# Patient Record
Sex: Male | Born: 1980 | Race: Black or African American | Hispanic: No | Marital: Married | Smoking: Current some day smoker
Health system: Southern US, Community
[De-identification: ages and names within clinical notes are randomized; demographics above are authoritative.]

## PROBLEM LIST (undated history)

## (undated) DIAGNOSIS — E669 Obesity, unspecified: Secondary | ICD-10-CM

## (undated) DIAGNOSIS — I10 Essential (primary) hypertension: Secondary | ICD-10-CM

---

## 2013-11-26 ENCOUNTER — Emergency Department: Payer: Self-pay | Admitting: Emergency Medicine

## 2015-06-03 ENCOUNTER — Emergency Department (HOSPITAL_COMMUNITY)
Admission: EM | Admit: 2015-06-03 | Discharge: 2015-06-03 | Disposition: A | Discharge status: Home / Self Care | Payer: Self-pay | Attending: Emergency Medicine | Admitting: Emergency Medicine

## 2015-06-03 ENCOUNTER — Encounter (HOSPITAL_COMMUNITY): Payer: Self-pay | Admitting: Emergency Medicine

## 2015-06-03 DIAGNOSIS — F17200 Nicotine dependence, unspecified, uncomplicated: Secondary | ICD-10-CM | POA: Insufficient documentation

## 2015-06-03 DIAGNOSIS — G510 Bell's palsy: Secondary | ICD-10-CM

## 2015-06-03 LAB — CBG MONITORING, ED: Glucose-Capillary: 85 mg/dL (ref 65–99)

## 2015-06-03 MED ORDER — HYPROMELLOSE (GONIOSCOPIC) 2.5 % OP SOLN: 2.0000 [drp] | OPHTHALMIC | Status: DC | PRN

## 2015-06-03 MED ORDER — PREDNISONE 20 MG PO TABS: 60.0000 mg | ORAL_TABLET | Freq: Every day | ORAL | Status: DC

## 2015-06-03 NOTE — ED Provider Notes (Signed)
CSN: [id number]     Arrival date & time 06/03/15  1104 History   First MD Initiated Contact with Patient 06/03/15 1127     Chief Complaint  Patient presents with  . Numbness    HPI  Adam Bush is an 34 y.o. male with no significant PMH who presents to the ED for evaluation of numbness on the R side of his face and loss of taste sensation. He states that three days ago he lost his sense of taste. He states that he has not been able to taste anything at all but can feel when there is something in his mouth. He reports he had a mint earlier today and could sort of taste it in the back of his mouth. He states that starting around 8:00AM this morning he noticed numbness to the R side of his face. He states he was brushing his teeth and was not able to close the right side of his mouth all the way to keep water in his mouth. States he noticed he also cannot close his R eye all the way. He states that he also has some decreased sensation on the R side of his face. Pt also endorses R jaw pain and earache for the past couple of days. He states the pain is constant, dull, and aching. He denies visual changes, hearing loss, dizziness, syncope. Denies weakness or numbness anywhere else in his body. Denies vertigo or ambulation problems. Denies chest pain, SOB, fever, chills, urinary issues, bowel/bladder incontinence. He does admit to smoking 5-10 cigarettes daily. Endorses recent increased stress due to moving and changes at his job.   History reviewed. No pertinent past medical history. History reviewed. No pertinent past surgical history. No family history on file. Social History  Substance Use Topics  . Smoking status: Current Every Day Smoker  . Smokeless tobacco: None  . Alcohol Use: Yes    Review of Systems  All other systems reviewed and are negative.     Allergies  Review of patient's allergies indicates no known allergies.  Home Medications   Prior to Admission medications    Medication Sig Start Date End Date Taking? Authorizing Provider  acetaminophen (TYLENOL) 500 MG tablet Take 2,000 mg by mouth daily as needed for headache.   Yes Historical Provider, MD   BP 150/91 mmHg  Pulse 101  Temp(Src) 98.2 F (36.8 C)  Resp 18  Ht  (1.753 m)  Wt 320 lb (145.151 kg)  BMI 47.23 kg/m2  SpO2 95% Physical Exam  Constitutional: He is oriented to person, place, and time. No distress.  HENT:  Head: Atraumatic.  Right Ear: External ear normal.  Left Ear: External ear normal.  Nose: Nose normal.  Mouth/Throat: Uvula is midline, oropharynx is clear and moist and mucous membranes are normal. No trismus in the jaw. No oropharyngeal exudate.  Eyes: Conjunctivae and EOM are normal. Pupils are equal, round, and reactive to light.  Neck: Normal range of motion. Neck supple. No tracheal deviation present.  Cardiovascular: Normal rate, regular rhythm, normal heart sounds and intact distal pulses.   No murmur heard. Pulmonary/Chest: Effort normal and breath sounds normal. No respiratory distress. He has no wheezes.  Abdominal: Soft. Bowel sounds are normal. He exhibits no distension. There is no tenderness.  Musculoskeletal: Normal range of motion. He exhibits no edema or tenderness.  Lymphadenopathy:    He has no cervical adenopathy.  Neurological: He is alert and oriented to person, place, and time. He displays a  negative Romberg sign. Coordination and gait normal.  R eyebrow slightly less raised than L, R eye cannot close as tightly as L. EOM intact. Pt reports decreased sensation to touch on R cheek compared to L. Otherwise nonfocal neuro exam. Good strength and sensation in upper and lower extremities. No pronator drift or romberg. Normal gait.   Skin: Skin is warm and dry. He is not diaphoretic.  Psychiatric: He has a normal mood and affect.  Nursing note and vitals reviewed.   ED Course  Procedures (including critical care time) Labs Review Labs Reviewed  CBG  MONITORING, ED    Imaging Review No results found. I have personally reviewed and evaluated these images and lab results as part of my medical decision-making.   EKG Interpretation None      MDM   Final diagnoses:  Bell's palsy    Neuro exam shows slightly decreased movement in R side of face and decreased sensation in R side of face compared to L. Otherwise nonfocal neuro exam. Suspicion low for stroke/ischemic etiology. Appears consistent with Bell's palsy. No further workup indicated at this time. Will d/c with rx for prednisone and artificial tears. Strict return precautions given. Pt verbalized understanding.     Carlene CoriaSerena Y Madden Garron, PA-C 06/03/15 1239  Pricilla LovelessScott Goldston, MD 06/03/15 [phone]

## 2015-06-03 NOTE — ED Notes (Signed)
Declined W/C at D/C and was escorted to lobby by RN. 

## 2015-06-03 NOTE — ED Notes (Signed)
CBG 85

## 2015-06-03 NOTE — ED Notes (Signed)
Reports decreased taste sensation X 3 days, woke with facial numbness this AM, has mild facial droop, no other neuro deficits, alert, ambulatory and in NAD

## 2015-06-03 NOTE — Discharge Instructions (Signed)
Bell Palsy Bell palsy is a condition in which the muscles on one side of the face become paralyzed. This often causes one side of the face to droop. It is a common condition and most people recover completely. RISK FACTORS Risk factors for Bell palsy include:  Pregnancy.  Diabetes.  An infection by a virus, such as infections that cause cold sores. CAUSES  Bell palsy is caused by damage to or inflammation of a nerve in your face. It is unclear why this happens, but an infection by a virus may lead to it. Most of the time the reason it happens is unknown. SIGNS AND SYMPTOMS  Symptoms can range from mild to severe and can take place over a number of hours. Symptoms may include:  Being unable to:  Raise one or both eyebrows.  Close one or both eyes.  Feel parts of your face (facial numbness).  Drooping of the eyelid and corner of the mouth.  Weakness in the face.  Paralysis of half your face.  Loss of taste.  Sensitivity to loud noises.  Difficulty chewing.  Tearing up of the affected eye.  Dryness in the affected eye.  Drooling.  Pain behind one ear. DIAGNOSIS  Diagnosis of Bell palsy may include:  A medical history and physical exam.  An MRI.  A CT scan.  Electromyography (EMG). This is a test that checks how your nerves are working. TREATMENT  Treatment may include antiviral medicine to help shorten the length of the condition. Sometimes treatment is not needed and the symptoms go away on their own. HOME CARE INSTRUCTIONS   Take medicines only as directed by your health care provider.  Do facial massages and exercises as directed by your health care provider.  If your eye is affected:  Use moisturizing eye drops to prevent drying of your eye as directed by your health care provider.  Protect your eye as directed by your health care provider. SEEK MEDICAL CARE IF:  Your symptoms do not get better or get worse.  You are drooling.  Your eye is red,  irritated, or hurts. SEEK IMMEDIATE MEDICAL CARE IF:   Another part of your body feels weak or numb.  You have difficulty swallowing.  You have a fever along with symptoms of Bell palsy.  You develop neck pain. MAKE SURE YOU:   Understand these instructions.  Will watch your condition.  Will get help right away if you are not doing well or get worse.   This information is not intended to replace advice given to you by your health care provider. Make sure you discuss any questions you have with your health care provider.   Document Released: 07/04/2005 Document Revised: 03/25/2015 Document Reviewed: 10/11/2013 Elsevier Interactive Patient Education 2016 [employer].  Take medications as prescribed. Return to the emergency room for worsening condition or new concerning symptoms. Follow up with your regular doctor. If you don't have a regular doctor use one of the numbers below to establish a primary care doctor.   Emergency Department Resource Guide 1) Find a Doctor and Pay Out of Pocket Although you won't have to find out who is covered by your insurance plan, it is a good idea to ask around and get recommendations. You will then need to call the office and see if the doctor you have chosen will accept you as a new patient and what types of options they offer for patients who are self-pay. Some doctors offer discounts or will set up  payment plans for their patients who do not have insurance, but you will need to ask so you aren't surprised when you get to your appointment.  2) Contact Your Local Health Department Not all health departments have doctors that can see patients for sick visits, but many do, so it is worth a call to see if yours does. If you don't know where your local health department is, you can check in your phone book. The CDC also has a tool to help you locate your state's health department, and many state websites also have listings of all of their local health  departments.  3) Find a Walk-in Clinic If your illness is not likely to be very severe or complicated, you may want to try a walk in clinic. These are popping up all over the country in pharmacies, drugstores, and shopping centers. They're usually staffed by nurse practitioners or physician assistants that have been trained to treat common illnesses and complaints. They're usually fairly quick and inexpensive. However, if you have serious medical issues or chronic medical problems, these are probably not your best option.  No Primary Care Doctor: - Call Health Connect at  [phone] - they can help you locate a primary care doctor that  accepts your insurance, provides certain services, etc. - Physician Referral [phone]  Emergency Department Resource Guide 1) Find a Doctor and Pay Out of Pocket Although you won't have to find out who is covered by your insurance plan, it is a good idea to ask around and get recommendations. You will then need to call the office and see if the doctor you have chosen will accept you as a new patient and what types of options they offer for patients who are self-pay. Some doctors offer discounts or will set up payment plans for their patients who do not have insurance, but you will need to ask so you aren't surprised when you get to your appointment.  2) Contact Your Local Health Department Not all health departments have doctors that can see patients for sick visits, but many do, so it is worth a call to see if yours does. If you don't know where your local health department is, you can check in your phone book. The CDC also has a tool to help you locate your state's health department, and many state websites also have listings of all of their local health departments.  3) Find a Walk-in Clinic If your illness is not likely to be very severe or complicated, you may want to try a walk in clinic. These are popping up all over the country in pharmacies,  drugstores, and shopping centers. They're usually staffed by nurse practitioners or physician assistants that have been trained to treat common illnesses and complaints. They're usually fairly quick and inexpensive. However, if you have serious medical issues or chronic medical problems, these are probably not your best option.  No Primary Care Doctor: - Call Health Connect at  [phone] - they can help you locate a primary care doctor that  accepts your insurance, provides certain services, etc. - Physician Referral Service- [phone]  Chronic Pain Problems: Organization         Address  Phone   Notes  [facility]  [phone] Patients need to be referred by their primary care doctor.   Medication Assistance: Organization         Address  Phone   Notes  [facility] Medication Assistance Program [address] [place], [place] [address] [phone] --Must be a resident of [place] -- Must have NO insurance coverage whatsoever (no Medicaid/ Medicare, etc.) -- The pt. MUST have a primary care doctor that directs their care regularly and follows them in the community   MedAssist  [phone]   [employer]  [phone]    Agencies that provide inexpensive medical care: Organization         Address  Phone   Notes  [facility]  [phone]   [facility]    [phone]   [facility] [address] [place], [place] [address] [phone]   [facility] [place] [address], [place] [phone]   [facility]    [phone]   [facility]    [phone]) [phone]   [facility]  [address], [place] Phone:  [phone], Fax:  [phone]) [phone] Hours of Operation:  9 am - 6 pm, M-F.  Also accepts Medicaid/Medicare and self-pay.  [facility]  [address], [place] Phone:  [phone](336) [phone], Fax: [phone]. Hours of Operation:  8:30 am - 5:30 pm, M-F.  Also accepts Medicaid and self-pay.  [facility] [place] [address], [place] Phone: [phone](336) [phone]   [facility] [address], [place], [place] [phone], Ext. [phone] Mondays & Thursdays: 7-9 AM.  First 15 patients are seen on a first come, first serve basis.    Medicaid-accepting [place] [facility] [place] Providers:  Organization         Address  Phone   Notes  [facility] [address], [place] [phone] Also accepts self-pay patients.  [facility] [address], [place]  [phone]   [facility] [address], [place] [phone]   [facility] [address] [place] [address], [place] [phone]   Renaye RakersVeita Bland [address], [place]   [phone] Only accepts [place] patients after they have their name applied to their card.   Self-Pay (no insurance) in [place] [facility] [place]:  Organization         Address  Phone   Notes  Sickle Cell Patients, [facility] [address], [place] [phone]   [facility] [address], [place] [phone]) [phone]   [facility] [place]  [address], [place] [phone]   [facility]/Dr. Osei-Bonsu  [address] [place] [address], [place] or [phone], [place] [phone]) [phone] Phone number for both [place] and [place] locations is the same.  [facility] [address], [place] [phone]) [phone]   [facility] [place] [address] [place] [address], [place] or [address] [phone]   [facility] [address], [place] [phone]) [phone], phone; [phone], fax Sees patients 1st and 3rd Saturday of every month.  Must not qualify for public  or private insurance (i.e. Medicaid, Medicare, [place] Health Choice, Veterans' Benefits)  Household income should be no more than 200% of the poverty level The clinic cannot treat you if you are pregnant or think you are pregnant  Sexually transmitted diseases are not treated at the clinic.

## 2015-06-08 ENCOUNTER — Emergency Department (HOSPITAL_COMMUNITY)
Admission: EM | Admit: 2015-06-08 | Discharge: 2015-06-08 | Disposition: A | Discharge status: Home / Self Care | Payer: Self-pay | Attending: Emergency Medicine | Admitting: Emergency Medicine

## 2015-06-08 ENCOUNTER — Encounter (HOSPITAL_COMMUNITY): Payer: Self-pay | Admitting: *Deleted

## 2015-06-08 DIAGNOSIS — Z7952 Long term (current) use of systemic steroids: Secondary | ICD-10-CM | POA: Insufficient documentation

## 2015-06-08 DIAGNOSIS — Z87891 Personal history of nicotine dependence: Secondary | ICD-10-CM | POA: Insufficient documentation

## 2015-06-08 DIAGNOSIS — G510 Bell's palsy: Secondary | ICD-10-CM | POA: Insufficient documentation

## 2015-06-08 MED ORDER — VALACYCLOVIR HCL 1 G PO TABS: 1000.0000 mg | ORAL_TABLET | Freq: Three times a day (TID) | ORAL | Status: AC

## 2015-06-08 MED ORDER — PREDNISONE 20 MG PO TABS: 40.0000 mg | ORAL_TABLET | Freq: Every day | ORAL | Status: DC

## 2015-06-08 NOTE — ED Provider Notes (Signed)
CSN: [id number]     Arrival date & time 06/08/15  1540 History  By signing my name below, I, Murriel Hopper, attest that this documentation has been prepared under the direction and in the presence of Will Fitzhugh Vizcarrondo, PA-C.  Electronically Signed: Murriel Hopper, ED Scribe. 06/08/2015. 6:37 PM.    No chief complaint on file.     The history is provided by the patient. No language interpreter was used.   HPI Comments: Adam Bush is a 34 y.o. male who presents to the Emergency Department complaining of symptoms of Bell's Palsy, which he was diagnosed with on 11/16. Pt states that he was prescribed Prednisone and has been taking it as prescribed since then. Pt reports today still with same symptoms of right sided facial weakness. He also states that his lower lip has been numb since he was last seen in ED. Pt states that he does not feel that his symptoms have gotten better since he was diagnosed. He reports still having a 5/10 headache.  Pt also reports he is able to close his eyes most of the time, and his eye dryness since his last visit has been under control. Pt denies numbness, tingling, or weakness to any other part of his body. He denies fevers, neck stiffness, rashes, abdominal pain, or eye dryness.    History reviewed. No pertinent past medical history. History reviewed. No pertinent past surgical history. No family history on file. Social History  Substance Use Topics  . Smoking status: Former Smoker    Quit date: 06/03/2015  . Smokeless tobacco: None  . Alcohol Use: Yes    Review of Systems  Constitutional: Negative for fever and chills.  HENT: Negative for congestion, hearing loss, rhinorrhea, sore throat, tinnitus and trouble swallowing.   Eyes: Negative for pain, redness and visual disturbance.  Respiratory: Negative for cough and shortness of breath.   Cardiovascular: Negative for chest pain.  Gastrointestinal: Negative for nausea, vomiting, abdominal pain and diarrhea.   Genitourinary: Negative for dysuria.  Musculoskeletal: Negative for back pain, neck pain and neck stiffness.  Skin: Negative for rash.  Neurological: Positive for facial asymmetry, weakness, numbness and headaches. Negative for dizziness, seizures, syncope, speech difficulty and light-headedness.      Allergies  Review of patient's allergies indicates no known allergies.  Home Medications   Prior to Admission medications   Medication Sig Start Date End Date Taking? Authorizing Provider  acetaminophen (TYLENOL) 500 MG tablet Take 2,000 mg by mouth daily as needed for headache.    Historical Provider, MD  hydroxypropyl methylcellulose / hypromellose (ISOPTO TEARS / GONIOVISC) 2.5 % ophthalmic solution Place 2 drops into the right eye as needed for dry eyes. 06/03/15   Ace Gins Sam, PA-C  predniSONE (DELTASONE) 20 MG tablet Take 3 tablets (60 mg total) by mouth daily. 06/03/15   Ace Gins Sam, PA-C  predniSONE (DELTASONE) 20 MG tablet Take 2 tablets (40 mg total) by mouth daily. 06/08/15   Everlene Farrier, PA-C  valACYclovir (VALTREX) 1000 MG tablet Take 1 tablet (1,000 mg total) by mouth 3 (three) times daily. 06/08/15 06/22/15  Everlene Farrier, PA-C   BP 142/106 mmHg  Pulse 94  Temp(Src) 98.1 F (36.7 C) (Oral)  Resp 16  Ht  (1.753 m)  Wt 153.117 kg  BMI 49.83 kg/m2  SpO2 96% Physical Exam  Constitutional: He is oriented to person, place, and time. He appears well-developed and well-nourished. No distress.  Nontoxic appearing.  HENT:  Head: Normocephalic and atraumatic.  Right Ear: External ear normal.  Left Ear: External ear normal.  Mouth/Throat: Oropharynx is clear and moist.  Right sided facial asymmetry consistent with bells palsy.   Eyes: Conjunctivae and EOM are normal. Pupils are equal, round, and reactive to light. Right eye exhibits no discharge. Left eye exhibits no discharge.  EOMs intact. Patient is able to close his right eye completely, but not as tight as his  left eye.   Neck: Normal range of motion. Neck supple.  Cardiovascular: Normal rate, regular rhythm, normal heart sounds and intact distal pulses.   Pulmonary/Chest: Effort normal and breath sounds normal. No respiratory distress. He has no wheezes. He has no rales.  Abdominal: Soft. There is no tenderness.  Musculoskeletal: Normal range of motion. He exhibits no edema.  Patient has 5 out of 5 strength in his bilateral upper and lower extremities. Normal gait.  Lymphadenopathy:    He has no cervical adenopathy.  Neurological: He is alert and oriented to person, place, and time. Coordination normal.  The patient is alert and oriented 3. Patient has right-sided facial asymmetry including his forehead which is consistent with Bell's palsy. Decreased sensation to the right side of his face. Patient has unequal smile. Patient is able to close his right eye completely but not as tightly as his left eye. No evidence of dry eye. Patient has good strength and sensation to his bilateral upper and lower extremities. Normal gait.  Skin: Skin is warm and dry. No rash noted. He is not diaphoretic. No erythema. No pallor.  Psychiatric: He has a normal mood and affect. His behavior is normal.  Nursing note and vitals reviewed.   ED Course  Procedures (including critical care time)  DIAGNOSTIC STUDIES: Oxygen Saturation is 96% on room air, normal by my interpretation.    COORDINATION OF CARE: 6:36 PM Discussed treatment plan with pt at bedside and pt agreed to plan.   Labs Review Labs Reviewed - No data to display  Imaging Review No results found.    EKG Interpretation None      Filed Vitals:   06/08/15 1553  BP: 142/106  Pulse: 94  Temp: 98.1 F (36.7 C)  TempSrc: Oral  Resp: 16  Height: 5\' 9"  (1.753 m)  Weight: 153.117 kg  SpO2: 96%     MDM   Meds given in ED:  Medications - No data to display  Discharge Medication List as of 06/08/2015  6:39 PM    START taking these  medications   Details  !! predniSONE (DELTASONE) 20 MG tablet Take 2 tablets (40 mg total) by mouth daily., Starting 06/08/2015, Until Discontinued, Print    valACYclovir (VALTREX) 1000 MG tablet Take 1 tablet (1,000 mg total) by mouth 3 (three) times daily., Starting 06/08/2015, Until Mon 06/22/15, Print     !! - Potential duplicate medications found. Please discuss with provider.      Final diagnoses:  Bell's palsy   This is a 34 year old male who presents to the emergency department with complaints of Bell's palsy. The patient was seen in the emergency department on 06/03/2015 is diagnosed with Bell's palsy. He reports his symptoms have persisted and not improved. He reports he's been taking his prednisone. He reports his dry eye has resolved. On examination is afebrile nontoxic appearing. Patient has right-sided facial asymmetry consistent with bells palsy. It includes his forehead. He is able to close his right eye completely, although not as tightly as his left eye. He has unequal smile. He has  decreased sensation to the right side of his face. Patient has good sensation and strength is bilateral upper and lower extremities. Patient's exam is consistent with persistent Bell's palsy. Patient has been using his eyedrops as needed. I encouraged him to continue to use them every day. Will have the patient continue another 5 day course of prednisone and start him on Valtrex. I provided more education to the patient about Bell's palsy and explained that the symptoms persist for a long period of time. I encouraged close follow up with PCP. I still have very low suspicion for stroke or ischemic etiology. We'll discharge with strict return precautions. I advised the patient to follow-up with their primary care provider this week. I advised the patient to return to the emergency department with new or worsening symptoms or new concerns. The patient verbalized understanding and agreement with plan.   This  patient was discussed with Dr. Donnald Garre who agrees with assessment and plan.   I personally performed the services described in this documentation, which was scribed in my presence. The recorded information has been reviewed and is accurate.       Everlene Farrier, PA-C 06/08/15 1851  Arby Barrette, MD 06/09/15 Moses Manners

## 2015-06-08 NOTE — ED Notes (Signed)
Pt states that he was here on Wednesday and was diagnosed with Bells Palsy and was prescribed Prednisone. States that he has been taking prednisone. States that initially he was taking the pills spread out but on Friday he started taking them as prescribed. States that his boss has sent him home from work because of his symptoms and thinks he needs to be checked out. States that his symptoms (numbness to right face) have no gotten better but have no gotten worse.

## 2015-06-08 NOTE — Discharge Instructions (Signed)
Bell Palsy °Bell palsy is a condition in which the muscles on one side of the face become paralyzed. This often causes one side of the face to droop. It is a common condition and most people recover completely. °RISK FACTORS °Risk factors for Bell palsy include: °· Pregnancy. °· Diabetes. °· An infection by a virus, such as infections that cause cold sores. °CAUSES  °Bell palsy is caused by damage to or inflammation of a nerve in your face. It is unclear why this happens, but an infection by a virus may lead to it. Most of the time the reason it happens is unknown. °SIGNS AND SYMPTOMS  °Symptoms can range from mild to severe and can take place over a number of hours. Symptoms may include: °· Being unable to: °¨ Raise one or both eyebrows. °¨ Close one or both eyes. °¨ Feel parts of your face (facial numbness). °· Drooping of the eyelid and corner of the mouth. °· Weakness in the face. °· Paralysis of half your face. °· Loss of taste. °· Sensitivity to loud noises. °· Difficulty chewing. °· Tearing up of the affected eye. °· Dryness in the affected eye. °· Drooling. °· Pain behind one ear. °DIAGNOSIS  °Diagnosis of Bell palsy may include: °· A medical history and physical exam. °· An MRI. °· A CT scan. °· Electromyography (EMG). This is a test that checks how your nerves are working. °TREATMENT  °Treatment may include antiviral medicine to help shorten the length of the condition. Sometimes treatment is not needed and the symptoms go away on their own. °HOME CARE INSTRUCTIONS  °· Take medicines only as directed by your health care provider. °· Do facial massages and exercises as directed by your health care provider. °· If your eye is affected: °¨ Use moisturizing eye drops to prevent drying of your eye as directed by your health care provider. °¨ Protect your eye as directed by your health care provider. °SEEK MEDICAL CARE IF: °· Your symptoms do not get better or get worse. °· You are drooling. °· Your eye is red,  irritated, or hurts. °SEEK IMMEDIATE MEDICAL CARE IF:  °· Another part of your body feels weak or numb. °· You have difficulty swallowing. °· You have a fever along with symptoms of Bell palsy. °· You develop neck pain. °MAKE SURE YOU:  °· Understand these instructions. °· Will watch your condition. °· Will get help right away if you are not doing well or get worse. °  °This information is not intended to replace advice given to you by your health care provider. Make sure you discuss any questions you have with your health care provider. °  °Document Released: 07/04/2005 Document Revised: 03/25/2015 Document Reviewed: 10/11/2013 °Elsevier Interactive Patient Education ©2016 [employer] ° °

## 2015-06-08 NOTE — ED Notes (Signed)
Pt. Stated, I have taken the prednisone and one dose left.  Its worse , I can't taste anything on the right side. Pt. Still has weakness ijn his right side of face .

## 2015-11-02 ENCOUNTER — Emergency Department (HOSPITAL_COMMUNITY)
Admission: EM | Admit: 2015-11-02 | Discharge: 2015-11-02 | Disposition: A | Discharge status: Home / Self Care | Payer: Self-pay | Attending: Emergency Medicine | Admitting: Emergency Medicine

## 2015-11-02 ENCOUNTER — Encounter (HOSPITAL_COMMUNITY): Payer: Self-pay | Admitting: *Deleted

## 2015-11-02 DIAGNOSIS — E669 Obesity, unspecified: Secondary | ICD-10-CM | POA: Insufficient documentation

## 2015-11-02 DIAGNOSIS — M546 Pain in thoracic spine: Secondary | ICD-10-CM | POA: Insufficient documentation

## 2015-11-02 DIAGNOSIS — Z87891 Personal history of nicotine dependence: Secondary | ICD-10-CM | POA: Insufficient documentation

## 2015-11-02 DIAGNOSIS — M549 Dorsalgia, unspecified: Secondary | ICD-10-CM

## 2015-11-02 DIAGNOSIS — Z7952 Long term (current) use of systemic steroids: Secondary | ICD-10-CM | POA: Insufficient documentation

## 2015-11-02 HISTORY — DX: Obesity, unspecified: E66.9

## 2015-11-02 MED ORDER — METHOCARBAMOL 500 MG PO TABS: 500.0000 mg | ORAL_TABLET | Freq: Two times a day (BID) | ORAL | Status: DC

## 2015-11-02 MED ORDER — NAPROXEN 500 MG PO TABS: 500.0000 mg | ORAL_TABLET | Freq: Two times a day (BID) | ORAL | Status: DC

## 2015-11-02 NOTE — ED Notes (Signed)
Pt reports left side back pain for days. Denies injury. Pain increases with movement, denies urinary symptoms. Pt is ambulatory at triage.

## 2015-11-02 NOTE — ED Provider Notes (Signed)
CSN: [id number]     Arrival date & time 11/02/15  1554 History  By signing my name below, I, Iona BeardChristian Pulliam, attest that this documentation has been prepared under the direction and in the presence of Constellation BrandsHeather Aminah Zabawa PA-C.   Electronically Signed: Iona Beardhristian Pulliam, ED Scribe 11/02/2015 at 5:29 PM.  Chief Complaint  Patient presents with  . Back Pain    The history is provided by the patient. No language interpreter was used.   HPI Comments: Adam Bush is a 35 y.o. male who presents to the Emergency Department complaining of gradual onset, throbbing, 3/10 left-sided mid back pain, ongoing for five days. Pt also complains of environmental allergies. No other associated symptoms noted. He states pain is worsened with quick movement. Pt has taken aleve and tylenol with no relief to symptoms. No other worsening or alleviating factors noted. Pt denies abdominal pain, chest pain, shortness of breath, cough, fever, bowel incontinence, urinary incontinence, urinary symptoms,  numbness, weakness, or any other pertinent symptoms.   Past Medical History  Diagnosis Date  . Obesity    History reviewed. No pertinent past surgical history. History reviewed. No pertinent family history. Social History  Substance Use Topics  . Smoking status: Former Smoker    Quit date: 06/03/2015  . Smokeless tobacco: None  . Alcohol Use: Yes    Review of Systems A complete 10 system review of systems was obtained and all systems are negative except as noted in the HPI and PMH.   Allergies  Review of patient's allergies indicates no known allergies.  Home Medications   Prior to Admission medications   Medication Sig Start Date End Date Taking? Authorizing Provider  acetaminophen (TYLENOL) 500 MG tablet Take 2,000 mg by mouth daily as needed for headache.    Historical Provider, MD  hydroxypropyl methylcellulose / hypromellose (ISOPTO TEARS / GONIOVISC) 2.5 % ophthalmic solution Place 2 drops into the  right eye as needed for dry eyes. 06/03/15   Ace GinsSerena Y Sam, PA-C  predniSONE (DELTASONE) 20 MG tablet Take 3 tablets (60 mg total) by mouth daily. 06/03/15   Ace GinsSerena Y Sam, PA-C  predniSONE (DELTASONE) 20 MG tablet Take 2 tablets (40 mg total) by mouth daily. 06/08/15   Everlene FarrierWilliam Dansie, PA-C   BP 136/100 mmHg  Pulse 82  Temp(Src) 98 F (36.7 C) (Oral)  Resp 18  Ht 5' 9.5" (1.765 m)  Wt 330 lb (149.687 kg)  BMI 48.05 kg/m2  SpO2 95% Physical Exam  Constitutional: He appears well-developed and well-nourished. No distress.  HENT:  Head: Normocephalic and atraumatic.  Eyes: Conjunctivae and EOM are normal.  Neck: Neck supple. No tracheal deviation present.  Cardiovascular: Normal rate, regular rhythm and normal heart sounds.  Exam reveals no gallop.   No murmur heard. Pulmonary/Chest: Effort normal and breath sounds normal. No respiratory distress. He has no wheezes. He has no rales.  Musculoskeletal: Normal range of motion. He exhibits no edema.  Pain of the left, mid back with twisting and extension.    Neurological: He is alert.  Reflex Scores:      Patellar reflexes are 2+ on the right side and 2+ on the left side. Distal sensation of both feet intact. Lower extremity strength is 5/5 bilaterally.   Skin: Skin is warm and dry.  Psychiatric: He has a normal mood and affect. His behavior is normal.    ED Course  Procedures (including critical care time) DIAGNOSTIC STUDIES: Oxygen Saturation is 95% on RA, adequate by my interpretation.  COORDINATION OF CARE: 5:29 PM-Discussed treatment plan with pt at bedside and pt agreed to plan.   Labs Review Labs Reviewed - No data to display  Imaging Review No results found. I have personally reviewed and evaluated these images and lab results as part of my medical decision-making.   EKG Interpretation None      MDM   Final diagnoses:  None  Patient with back pain.  No neurological deficits and normal neuro exam.  Patient can  walk but states is painful.  No loss of bowel or bladder control.  No concern for cauda equina.  No fever, night sweats, weight loss, h/o cancer, IVDU.  RICE protocol and pain medicine indicated and discussed with patient.    I personally performed the services described in this documentation, which was scribed in my presence. The recorded information has been reviewed and is accurate.    Santiago Glad, PA-C 11/02/15 2305  Lavera Guise, MD 11/03/15 [phone]

## 2015-11-02 NOTE — ED Notes (Signed)
Pt ambulates independently and with steady gait at time of discharge. Discharge instructions and follow up information reviewed with patient. No other questions or concerns voiced at this time.  

## 2015-11-23 ENCOUNTER — Encounter (HOSPITAL_COMMUNITY): Payer: Self-pay | Admitting: Emergency Medicine

## 2015-11-23 ENCOUNTER — Emergency Department (HOSPITAL_COMMUNITY): Payer: Self-pay

## 2015-11-23 ENCOUNTER — Emergency Department (HOSPITAL_COMMUNITY)
Admission: EM | Admit: 2015-11-23 | Discharge: 2015-11-24 | Disposition: A | Discharge status: Home / Self Care | Payer: Self-pay | Attending: Emergency Medicine | Admitting: Emergency Medicine

## 2015-11-23 DIAGNOSIS — Z87891 Personal history of nicotine dependence: Secondary | ICD-10-CM | POA: Insufficient documentation

## 2015-11-23 DIAGNOSIS — M549 Dorsalgia, unspecified: Secondary | ICD-10-CM

## 2015-11-23 DIAGNOSIS — I10 Essential (primary) hypertension: Secondary | ICD-10-CM | POA: Insufficient documentation

## 2015-11-23 DIAGNOSIS — M545 Low back pain, unspecified: Secondary | ICD-10-CM

## 2015-11-23 HISTORY — DX: Essential (primary) hypertension: I10

## 2015-11-23 NOTE — ED Notes (Signed)
NP to see and assess patient before RN assessment. See NP note. 

## 2015-11-23 NOTE — ED Notes (Signed)
Pt. reports low back pain onset 2 weeks ago , denies injury or fall / ambulatory , no urinary discomfort or hematuria , pain increases with movement /changing positions .

## 2015-11-23 NOTE — ED Provider Notes (Signed)
CSN: [id number]     Arrival date & time 11/23/15  1952 History  By signing my name below, I, Tanda Rockers, attest that this documentation has been prepared under the direction and in the presence of Kerrie Buffalo, NP. Electronically Signed: Tanda Rockers, ED Scribe. 11/23/2015. 11:17 PM.   Chief Complaint  Patient presents with  . Back Pain   Patient is a 35 y.o. male presenting with back pain. The history is provided by the patient. No language interpreter was used.  Back Pain Location:  Lumbar spine and thoracic spine Quality:  Aching Radiates to:  Does not radiate Pain severity:  Moderate Pain is:  Same all the time Onset quality:  Gradual Duration:  2 weeks Timing:  Constant Progression:  Unchanged Chronicity:  New Context: not recent injury   Worsened by:  Movement and bending Ineffective treatments:  NSAIDs and muscle relaxants Associated symptoms: no bladder incontinence, no bowel incontinence, no numbness, no paresthesias, no perianal numbness, no tingling and no weakness      HPI Comments: Adam Bush is a 35 y.o. male who presents to the Emergency Department complaining of gradual onset, constant, 4/10, mid back pain x 2 weeks. No known injury or trauma to the back. Pt states that he was seen in the ED 2 weeks ago for this pain and was prescribed medications without relief, prompting him to come to the ED tonight. The pain is exacerbated with coughing. Denies weakness, numbness, tingling, urinary or bowel incontinence, or any other associated symptoms.   Per chart review: Pt was seen in the ED on 11/02/2015 for the back pain. He did not have an x ray done at that time but was given prescriptions for Robaxin and Naprosyn.   Past Medical History  Diagnosis Date  . Obesity   . Hypertension   . Obesity    History reviewed. No pertinent past surgical history. No family history on file. Social History  Substance Use Topics  . Smoking status: Former Smoker    Quit  date: 06/03/2015  . Smokeless tobacco: None  . Alcohol Use: Yes    Review of Systems  Gastrointestinal: Negative for bowel incontinence.       Negative for bowel incontinence  Genitourinary: Negative for bladder incontinence.       Negative for urinary incontinence  Musculoskeletal: Positive for back pain.  Neurological: Negative for tingling, weakness, numbness and paresthesias.  All other systems reviewed and are negative.  Allergies  Review of patient's allergies indicates no known allergies.  Home Medications   Prior to Admission medications   Medication Sig Start Date End Date Taking? Authorizing Provider  acetaminophen (TYLENOL) 500 MG tablet Take 2,000 mg by mouth daily as needed for headache.    Historical Provider, MD  diclofenac (VOLTAREN) 50 MG EC tablet Take 1 tablet (50 mg total) by mouth 2 (two) times daily. 11/24/15   Hope Orlene Och, NP  hydroxypropyl methylcellulose / hypromellose (ISOPTO TEARS / GONIOVISC) 2.5 % ophthalmic solution Place 2 drops into the right eye as needed for dry eyes. 06/03/15   Ace Gins Sam, PA-C  tiZANidine (ZANAFLEX) 4 MG tablet Take 1 tablet (4 mg total) by mouth every 6 (six) hours as needed for muscle spasms. 11/24/15   Hope Orlene Och, NP   BP 124/78 mmHg  Pulse 84  Temp(Src) 98.2 F (36.8 C) (Oral)  Resp 18  Ht  (1.753 m)  Wt 153.514 kg  BMI 49.96 kg/m2  SpO2 100%   Physical Exam  Constitutional: He is oriented to person, place, and time. He appears well-developed and well-nourished. No distress.  Morbidly obese  HENT:  Head: Normocephalic and atraumatic.  Eyes: Conjunctivae and EOM are normal. Pupils are equal, round, and reactive to light.  Neck: Normal range of motion. Neck supple. No tracheal deviation present.  Cardiovascular: Normal rate and regular rhythm.   Pulmonary/Chest: Effort normal. No respiratory distress. He has no wheezes. He has no rales.  Abdominal: Soft. Bowel sounds are normal. There is no tenderness.   Musculoskeletal: He exhibits tenderness. He exhibits no edema.       Thoracic back: He exhibits tenderness. He exhibits normal range of motion, no laceration and normal pulse.  Tenderness over the thoracic spine.  Grips are equal bilaterally.  Radial pulses are 2+.  Sensation intact.  Steady gait without foot drag.   Neurological: He is alert and oriented to person, place, and time. He has normal strength. No cranial nerve deficit or sensory deficit. Coordination and gait normal.  Reflex Scores:      Bicep reflexes are 2+ on the right side and 2+ on the left side.      Brachioradialis reflexes are 2+ on the right side and 2+ on the left side.      Patellar reflexes are 2+ on the right side and 2+ on the left side.      Achilles reflexes are 2+ on the right side and 2+ on the left side. Skin: Skin is warm and dry.  Psychiatric: He has a normal mood and affect. His behavior is normal.  Nursing note and vitals reviewed.   ED Course  Procedures (including critical care time)  DIAGNOSTIC STUDIES: Oxygen Saturation is 97% on RA, normal by my interpretation.    COORDINATION OF CARE: 11:16 PM-Discussed treatment plan which includes DG T Spine with pt at bedside and pt agreed to plan.   Labs Review Labs Reviewed - No data to display  Imaging Review Dg Thoracic Spine W/swimmers  11/23/2015  CLINICAL DATA:  Thoracic back pain for 2 weeks.  No known injury. EXAM: THORACIC SPINE - 3 VIEWS COMPARISON:  None. FINDINGS: There is no evidence of thoracic spine fracture. Alignment is normal. No other significant bone abnormalities are identified. IMPRESSION: Negative thoracic spine radiographs. Electronically Signed   By: Myles RosenthalJohn  Stahl M.D.   On: 11/23/2015 23:58    MDM  35 y.o. male with low back pain x 2 weeks stable for d/c without focal neuro deficits and no acute findings on x-ray. No red flags to indicate immediate consult with neuro. Will treat muscle spasm and inflammation and patient to  follow up with ortho. Discussed with the patient and all questioned fully answered. Final diagnoses:  Bilateral low back pain without sciatica   I personally performed the services described in this documentation, which was scribed in my presence. The recorded information has been reviewed and is accurate.     [address]Hope South WeldonM Neese, NP 11/24/15 0145  Richardean Canalavid H Yao, MD 11/24/15 [phone]

## 2015-11-24 MED ORDER — TIZANIDINE HCL 4 MG PO TABS: 4.0000 mg | ORAL_TABLET | Freq: Four times a day (QID) | ORAL | Status: DC | PRN

## 2015-11-24 MED ORDER — DICLOFENAC SODIUM 50 MG PO TBEC: 50.0000 mg | DELAYED_RELEASE_TABLET | Freq: Two times a day (BID) | ORAL | Status: DC

## 2015-11-24 NOTE — ED Notes (Signed)
Patient verbalized understanding of discharge instructions and denies any further needs or questions at this time. VS stable. Patient ambulatory with steady gait.  

## 2015-11-24 NOTE — Discharge Instructions (Signed)
Your x-ray tonight does not show any broken bones. If the pain continues follow up with Dr. Roda ShuttersXu.  Do not take the muscle relaxant if driving as it will make you sleepy.   Back Pain, Adult Back pain is very common. The pain often gets better over time. The cause of back pain is usually not dangerous. Most people can learn to manage their back pain on their own.  HOME CARE  Watch your back pain for any changes. The following actions may help to lessen any pain you are feeling:  Stay active. Start with short walks on flat ground if you can. Try to walk farther each day.  Exercise regularly as told by your doctor. Exercise helps your back heal faster. It also helps avoid future injury by keeping your muscles strong and flexible.  Do not sit, drive, or stand in one place for more than 30 minutes.  Do not stay in bed. Resting more than 1-2 days can slow down your recovery.  Be careful when you bend or lift an object. Use good form when lifting:  Bend at your knees.  Keep the object close to your body.  Do not twist.  Sleep on a firm mattress. Lie on your side, and bend your knees. If you lie on your back, put a pillow under your knees.  Take medicines only as told by your doctor.  Put ice on the injured area.  Put ice in a plastic bag.  Place a towel between your skin and the bag.  Leave the ice on for 20 minutes, 2-3 times a day for the first 2-3 days. After that, you can switch between ice and heat packs.  Avoid feeling anxious or stressed. Find good ways to deal with stress, such as exercise.  Maintain a healthy weight. Extra weight puts stress on your back. GET HELP IF:   You have pain that does not go away with rest or medicine.  You have worsening pain that goes down into your legs or buttocks.  You have pain that does not get better in one week.  You have pain at night.  You lose weight.  You have a fever or chills. GET HELP RIGHT AWAY IF:   You cannot control  when you poop (bowel movement) or pee (urinate).  Your arms or legs feel weak.  Your arms or legs lose feeling (numbness).  You feel sick to your stomach (nauseous) or throw up (vomit).  You have belly (abdominal) pain.  You feel like you may pass out (faint).   This information is not intended to replace advice given to you by your health care provider. Make sure you discuss any questions you have with your health care provider.   Document Released: 12/21/2007 Document Revised: 07/25/2014 Document Reviewed: 11/05/2013 Elsevier Interactive Patient Education [employer]2016 Elsevier Inc.

## 2016-01-11 ENCOUNTER — Encounter (HOSPITAL_COMMUNITY): Payer: Self-pay | Admitting: Emergency Medicine

## 2016-01-11 DIAGNOSIS — R6884 Jaw pain: Secondary | ICD-10-CM | POA: Insufficient documentation

## 2016-01-11 DIAGNOSIS — Z87891 Personal history of nicotine dependence: Secondary | ICD-10-CM | POA: Insufficient documentation

## 2016-01-11 DIAGNOSIS — Z79899 Other long term (current) drug therapy: Secondary | ICD-10-CM | POA: Insufficient documentation

## 2016-01-11 DIAGNOSIS — I10 Essential (primary) hypertension: Secondary | ICD-10-CM | POA: Insufficient documentation

## 2016-01-11 DIAGNOSIS — K0889 Other specified disorders of teeth and supporting structures: Secondary | ICD-10-CM | POA: Insufficient documentation

## 2016-01-11 NOTE — ED Notes (Signed)
Patient had work done on a tooth on lower right jaw, now having pain at same tooth and behind that tooth.  Patient states that the pain radiates up his jaw and into right ear.

## 2016-01-12 ENCOUNTER — Emergency Department (HOSPITAL_COMMUNITY)
Admission: EM | Admit: 2016-01-12 | Discharge: 2016-01-12 | Disposition: A | Discharge status: Home / Self Care | Payer: MEDICAID | Attending: Emergency Medicine | Admitting: Emergency Medicine

## 2016-01-12 DIAGNOSIS — K0889 Other specified disorders of teeth and supporting structures: Secondary | ICD-10-CM

## 2016-01-12 MED ORDER — IBUPROFEN 800 MG PO TABS
800.0000 mg | ORAL_TABLET | Freq: Three times a day (TID) | ORAL | Status: DC | PRN
Start: 2016-01-12 — End: 2017-01-30

## 2016-01-12 MED ORDER — PENICILLIN V POTASSIUM 500 MG PO TABS: 500.0000 mg | ORAL_TABLET | Freq: Three times a day (TID) | ORAL | Status: AC

## 2016-01-12 NOTE — ED Provider Notes (Signed)
CSN: [id number]     Arrival date & time 01/11/16  2129 History   First MD Initiated Contact with Patient 01/12/16 0021     Chief Complaint  Patient presents with  . Dental Pain  . Jaw Pain    (Consider location/radiation/quality/duration/timing/severity/associated sxs/prior Treatment) Patient is a 35 y.o. male presenting with tooth pain. The history is provided by the patient and medical records. No language interpreter was used.  Dental Pain Associated symptoms: no facial swelling and no fever     Adam Bush is a 35 y.o. male  with a PMH of HTN who presents to the Emergency Department complaining of improving right lower dental pain x 2-3 days. Patient states pain was initially throbbing, he took Aleve and ibuprofen with some relief. Also used ice and cold water as well as frozen blueberries to the area which seems to provide great relief of his pain. He states this morning his pain was down to 1/10, however he just wanted to get his mouth checked to make sure everything was okay. Denies fever, chills, difficulty breathing, difficulty swallowing, recent illness. [place]oved to [place] and has not found dentist yet.   Past Medical History  Diagnosis Date  . Obesity   . Hypertension   . Obesity    History reviewed. No pertinent past surgical history. No family history on file. Social History  Substance Use Topics  . Smoking status: Former Smoker    Quit date: 06/03/2015  . Smokeless tobacco: None  . Alcohol Use: Yes    Review of Systems  Constitutional: Negative for fever.  HENT: Negative for facial swelling and voice change.   Respiratory: Negative for cough and shortness of breath.   Cardiovascular: Negative for chest pain.      Allergies  Review of patient's allergies indicates no known allergies.  Home Medications   Prior to Admission medications   Medication Sig Start Date End Date Taking? Authorizing Provider    acetaminophen (TYLENOL) 500 MG tablet Take 2,000 mg by mouth daily as needed for headache.    Historical Provider, MD  diclofenac (VOLTAREN) 50 MG EC tablet Take 1 tablet (50 mg total) by mouth 2 (two) times daily. 11/24/15   Hope Orlene OchM Neese, NP  hydroxypropyl methylcellulose / hypromellose (ISOPTO TEARS / GONIOVISC) 2.5 % ophthalmic solution Place 2 drops into the right eye as needed for dry eyes. 06/03/15   Ace GinsSerena Y Sam, PA-C  tiZANidine (ZANAFLEX) 4 MG tablet Take 1 tablet (4 mg total) by mouth every 6 (six) hours as needed for muscle spasms. 11/24/15   Hope Orlene OchM Neese, NP   BP 134/89 mmHg  Pulse 99  Temp(Src) 99.4 F (37.4 C) (Oral)  Resp 20  SpO2 100% Physical Exam  Constitutional: He is oriented to person, place, and time. He appears well-developed and well-nourished.  Alert and in no acute distress  HENT:  Head: Normocephalic and atraumatic.  Mouth/Throat:    Pain along tooth as depicted in image, midline uvula, no trismus, oropharynx moist and clear, no abscess noted, no oropharyngeal erythema or edema, neck supple and no tenderness. No facial edema.  Cardiovascular: Normal rate, regular rhythm, normal heart sounds and intact distal pulses.  Exam reveals no gallop and no friction rub.   No murmur heard. Pulmonary/Chest: Effort normal and breath sounds normal. No respiratory distress. He has no wheezes. He has no rales. He exhibits no tenderness.  Abdominal: Soft. Bowel sounds are normal. He exhibits no distension  and no mass. There is no tenderness. There is no rebound and no guarding.  Musculoskeletal: He exhibits no edema.  Neurological: He is alert and oriented to person, place, and time.  Skin: Skin is warm and dry.  Nursing note and vitals reviewed.   ED Course  Procedures (including critical care time) Labs Review Labs Reviewed - No data to display  Imaging Review No results found. I have personally reviewed and evaluated these images and lab results as part of my medical  decision-making.   EKG Interpretation None      MDM   Final diagnoses:  None   Patient with dentalgia. No abscess requiring immediate incision and drainage. Does not appear infected on exam - appears more like wisdom teeth cutting through, however will cover with short course of ABX. Patient is afebrile, non toxic appearing, and swallowing secretions well. Exam not concerning for Ludwig's angina or pharyngeal abscess.  I provided dental resource guide and stressed the importance of dental follow up for ultimate management of dental pain. Patient voices understanding and is agreeable to plan.  [facility] Pilcher Leanah Kolander, PA-C 01/12/16 [id number]  Azalia BilisKevin Campos, MD 01/12/16 [phone]

## 2016-01-12 NOTE — ED Notes (Signed)
PA at bedside.

## 2016-01-12 NOTE — Discharge Instructions (Signed)
You have a dental injury. It is very important that you get evaluated by a dentist as soon as possible. Call tomorrow to schedule an appointment. Ibuprofen as needed for pain. Take your full course of antibiotics. Read the instructions below. ° °Eat a soft or liquid diet and rinse your mouth out after meals with warm water. You should see a dentist or return here at once if you have increased swelling, increased pain or uncontrolled bleeding from the site of your injury. ° °SEEK MEDICAL CARE IF:  °You have increased pain not controlled with medicines.  °You have swelling around your tooth, in your face or neck.  °You have bleeding which starts, continues, or gets worse.  °You have a fever >101 °If you are unable to open your mouth °

## 2016-02-02 ENCOUNTER — Emergency Department (HOSPITAL_COMMUNITY)
Admission: EM | Admit: 2016-02-02 | Discharge: 2016-02-02 | Disposition: A | Discharge status: Home / Self Care | Payer: MEDICAID | Attending: Emergency Medicine | Admitting: Emergency Medicine

## 2016-02-02 ENCOUNTER — Encounter (HOSPITAL_COMMUNITY): Payer: Self-pay | Admitting: Emergency Medicine

## 2016-02-02 DIAGNOSIS — K029 Dental caries, unspecified: Secondary | ICD-10-CM

## 2016-02-02 DIAGNOSIS — Z87891 Personal history of nicotine dependence: Secondary | ICD-10-CM | POA: Insufficient documentation

## 2016-02-02 DIAGNOSIS — I10 Essential (primary) hypertension: Secondary | ICD-10-CM | POA: Insufficient documentation

## 2016-02-02 MED ORDER — AMOXICILLIN 500 MG PO CAPS: 500.0000 mg | ORAL_CAPSULE | Freq: Three times a day (TID) | ORAL | Status: DC

## 2016-02-02 MED ORDER — NAPROXEN 500 MG PO TABS: 500.0000 mg | ORAL_TABLET | Freq: Two times a day (BID) | ORAL | Status: DC

## 2016-02-02 NOTE — ED Notes (Signed)
Pt sts right sided dental pain 

## 2016-02-02 NOTE — ED Provider Notes (Signed)
CSN: [id number]     Arrival date & time 02/02/16  1523 History  By signing my name below, I, Adam Bush, attest that this documentation has been prepared under the direction and in the presence of Kerrie Buffalo, NP Electronically Signed: Soijett Bush, ED Scribe. 02/02/2016. 4:37 PM.   Chief Complaint  Patient presents with  . Dental Pain      Patient is a 35 y.o. male presenting with tooth pain. The history is provided by the patient. No language interpreter was used.  Dental Pain Location:  Lower Lower teeth location:  32/RL 3rd molar Quality:  Throbbing Severity:  Moderate Onset quality:  Sudden Timing:  Intermittent Progression:  Unchanged Chronicity:  Recurrent Context: dental caries   Previous work-up:  Dental exam Relieved by:  None tried Worsened by:  Nothing tried Ineffective treatments:  None tried Associated symptoms: headaches   Associated symptoms: no difficulty swallowing, no drooling, no facial swelling, no fever, no gum swelling and no trismus     Adam Bush is a 35 y.o. male with a medical hx of HTN who presents to the Emergency Department complaining of 8/10, throbbing, right lower dental pain onset 2 days. Pt states that he has a hole to his right lower tooth. Pt notes that he was seen in the ED for right lower dental pain last month in the ED and Rx medications that he didn't take due to the pain resolving the next day. He states that has not tried any medications for the relief for his symptoms. He denies trouble swallowing, fever, chills, drooling, and any other symptoms.    Per pt chart review: Pt was seen in the ED on 01/12/2016 for right lower dental pain. Pt was Rx ibuprofen and penicillin for his symptoms that he did not fill.   Past Medical History  Diagnosis Date  . Obesity   . Hypertension   . Obesity    History reviewed. No pertinent past surgical history. History reviewed. No pertinent family history. Social History  Substance Use Topics   . Smoking status: Former Smoker    Quit date: 06/03/2015  . Smokeless tobacco: None  . Alcohol Use: Yes    Review of Systems  Constitutional: Negative for fever and chills.  HENT: Positive for dental problem (right lower). Negative for drooling, facial swelling and trouble swallowing.   Skin: Negative for rash.  Neurological: Positive for headaches.  Hematological: Negative for adenopathy.  Psychiatric/Behavioral: Negative for confusion.      Allergies  Review of patient's allergies indicates no known allergies.  Home Medications   Prior to Admission medications   Medication Sig Start Date End Date Taking? Authorizing Provider  acetaminophen (TYLENOL) 500 MG tablet Take 2,000 mg by mouth daily as needed for headache.    Historical Provider, MD  amoxicillin (AMOXIL) 500 MG capsule Take 1 capsule (500 mg total) by mouth 3 (three) times daily. 02/02/16   Hope Orlene Och, NP  diclofenac (VOLTAREN) 50 MG EC tablet Take 1 tablet (50 mg total) by mouth 2 (two) times daily. 11/24/15   Hope Orlene Och, NP  hydroxypropyl methylcellulose / hypromellose (ISOPTO TEARS / GONIOVISC) 2.5 % ophthalmic solution Place 2 drops into the right eye as needed for dry eyes. 06/03/15   Ace Gins Sam, PA-C  ibuprofen (ADVIL,MOTRIN) 800 MG tablet Take 1 tablet (800 mg total) by mouth every 8 (eight) hours as needed for mild pain or moderate pain. 01/12/16   Chase Picket Ward, PA-C  naproxen (NAPROSYN) 500 MG  tablet Take 1 tablet (500 mg total) by mouth 2 (two) times daily. 02/02/16   Hope Orlene OchM Neese, NP  tiZANidine (ZANAFLEX) 4 MG tablet Take 1 tablet (4 mg total) by mouth every 6 (six) hours as needed for muscle spasms. 11/24/15   Hope Orlene OchM Neese, NP   BP 144/76 mmHg  Pulse 100  Temp(Src) 98.8 F (37.1 C) (Oral)  Resp 18  SpO2 99% Physical Exam  Constitutional: He is oriented to person, place, and time. He appears well-developed and well-nourished. No distress.  HENT:  Head: Normocephalic and atraumatic.   Mouth/Throat: Uvula is midline, oropharynx is clear and moist and mucous membranes are normal. No trismus in the jaw. No dental abscesses or uvula swelling. No posterior oropharyngeal edema or posterior oropharyngeal erythema.  Right 3rd molar with decay. Swelling and erythema to the gums surrounding the teeth and TTP. No visible abscess.   Eyes: EOM are normal.  Neck: Neck supple.  Cardiovascular: Normal rate, regular rhythm and normal heart sounds.  Exam reveals no gallop and no friction rub.   No murmur heard. Pulmonary/Chest: Effort normal and breath sounds normal. No respiratory distress. He has no wheezes. He has no rales.  Abdominal: He exhibits no distension.  Musculoskeletal: Normal range of motion.  Lymphadenopathy:    He has no cervical adenopathy.  Neurological: He is alert and oriented to person, place, and time.  Skin: Skin is warm and dry.  Psychiatric: He has a normal mood and affect. His behavior is normal.  Nursing note and vitals reviewed.   ED Course  Procedures (including critical care time) DIAGNOSTIC STUDIES: Oxygen Saturation is 99% on RA, nl by my interpretation.    COORDINATION OF CARE: 4:34 PM Discussed treatment plan with pt at bedside which includes referral to oral surgeon, abx Rx, and pt agreed to plan.   MDM   Final diagnoses:  Pain due to dental caries    Patient with dentalgia.  No abscess requiring immediate incision and drainage.  Exam not concerning for Ludwig's angina or pharyngeal abscess.  Will discharge with amoxil and naprosyn Rx. Pt instructed to follow-up with dentist.  Discussed return precautions. Pt safe for discharge.  I personally performed the services described in this documentation, which was scribed in my presence. The recorded information has been reviewed and is accurate.    [address]Hope ClayM Neese, [place]NP 02/05/16 1642  Linwood DibblesJon Knapp, MD 02/06/16 [phone]

## 2016-02-02 NOTE — Discharge Instructions (Signed)
I have given you the dental resources. Also you can Call Dr. Benson NorwayScott Jenson or Dr. Retta MacMohorn or Dr. Hyacinth MeekerMiller who are all oral surgeons to have your wisdom teeth removed.   Dental Caries Dental caries is tooth decay. This decay can cause a hole in teeth (cavity) that can get bigger and deeper over time. HOME CARE  Brush and floss your teeth. Do this at least two times a day.  Use a fluoride toothpaste.  Use a mouth rinse if told by your dentist or doctor.  Eat less sugary and starchy foods. Drink less sugary drinks.  Avoid snacking often on sugary and starchy foods. Avoid sipping often on sugary drinks.  Keep regular checkups and cleanings with your dentist.  Use fluoride supplements if told by your dentist or doctor.  Allow fluoride to be applied to teeth if told by your dentist or doctor.   This information is not intended to replace advice given to you by your health care provider. Make sure you discuss any questions you have with your health care provider.   Document Released: 04/12/2008 Document Revised: 07/25/2014 Document Reviewed: 07/06/2012 Elsevier Interactive Patient Education 2016 [employer] Resource Guide Dental The [employer] 211 is a great source of information about community services available.  Access by dialing 2-1-1 from anywhere in [place], or by website -  PooledIncome.plwww.nc211.org.   Other Local Resources (Updated 07/2015)  Dental  Care   Services    Phone Number and Address  Cost  [facility] For children 700 - 33 years of age:  Cleaning Tooth brushing/flossing instruction Sealants, fillings, crowns Extractions Emergency treatment  [phone] [address] [place], [place] [id number] Charges based on family income.  Medicaid and some insurance plans accepted.     [facility] Cleaning Sealants, fillings, crowns Extractions Emergency treatment [phone] [place], [place]  Pregnant women 10018 years of age or older with a Medicaid card  [facility] [place] Cleaning Sealants, fillings, crowns Extractions Emergency treatment [phone] [address] [place], [place] Pregnant women 418 years of age or older with a Medicaid card  [facility] For children 320 - 33 years of age:  Cleaning Tooth brushing/flossing instruction Sealants, fillings, crowns Extractions Emergency treatment Limited orthodontic services for patients with Medicaid [phone] [address] [place], [place] [id number] Medicaid and [facility] Health Choice cover for children up to age 33 and pregnant women.  Parents of children up to age 33 without Medicaid pay a reduced fee at time of service.  [facility] [place] For children 730 - 33 years of age:  Cleaning Tooth brushing/flossing instruction Sealants, fillings, crowns Extractions Emergency treatment Limited orthodontic services for patients with Medicaid [phone] [address] [place], [place].  Medicaid and [place] Health Choice cover for children up to age 33 and pregnant women.  Parents of children up to age 33 without Medicaid pay a reduced fee.  [facility] Cleaning Sealants, fillings, crowns Extractions  Hours: Tuesdays and Thursdays, 4:15 - 8 pm [phone] [address] [place], [place] [id number] Services free of charge to [facility] residents ages 18-64 who do not have health insurance, Medicare, [place], or [place] benefits and fall within federal poverty guidelines  [facility]    Provides dental care in  addition to primary medical care, nutritional counseling, and pharmacy: Cleaning Sealants, fillings, crowns Extractions                  [phone] [facility],  [address] [place], [place]  [phone] [facility], [address] [place], [place]  [phone] [facility] [place], [place]  [phone] [facility], [address] [place], [place]  [phone] [facility] [address] [place], [place] Accepts [place], [place], most insurance.  Also provides services available to all with fees adjusted based on ability to pay.    [facility] Cleaning Tooth brushing/flossing instruction Sealants, fillings, crowns Extractions Emergency treatment Hours: Tuesdays, Thursdays, and Fridays from 8 am to 5 pm by appointment only. [phone] [address] [place], [place] [address] [facility] residents with Medicaid (depending on eligibility) and children with [facility] Health Choice - call for more information.  [facility] Extractions only  Hours: 2nd and 4th Thursday of each month from 6:30 am - 9 am.   [phone] [address] [place], [place] [address] Ages 77 and older only.  Patients are seen on a first come, first served basis.  [employer] Extractions Orthodontics Endodontics Implants/Crowns/Bridges Complete and partial dentures [phone] [place], [place] Patients must complete an application for services.  There is often a waiting list.

## 2016-02-09 ENCOUNTER — Emergency Department (HOSPITAL_COMMUNITY)
Admission: EM | Admit: 2016-02-09 | Discharge: 2016-02-09 | Disposition: A | Discharge status: Home / Self Care | Payer: Self-pay | Attending: Emergency Medicine | Admitting: Emergency Medicine

## 2016-02-09 ENCOUNTER — Encounter (HOSPITAL_COMMUNITY): Payer: Self-pay | Admitting: Emergency Medicine

## 2016-02-09 DIAGNOSIS — I10 Essential (primary) hypertension: Secondary | ICD-10-CM | POA: Insufficient documentation

## 2016-02-09 DIAGNOSIS — K047 Periapical abscess without sinus: Secondary | ICD-10-CM | POA: Insufficient documentation

## 2016-02-09 DIAGNOSIS — Z87891 Personal history of nicotine dependence: Secondary | ICD-10-CM | POA: Insufficient documentation

## 2016-02-09 NOTE — ED Triage Notes (Signed)
Patient c/o absess/dental pain in right back bottom molar. States he came last week for pain and was prescribed Amoxicillin and Naproxen for possible infection. States the absess didn't appear until yesterday. States he has been using oragel to numb the pain.

## 2016-02-09 NOTE — ED Provider Notes (Signed)
[facility]Y DEPT Provider Note   CSN: [id number] Arrival date & time: 02/09/16  1643  First Provider Contact:  None    By signing my name below, I, Placido Sou, attest that this documentation has been prepared under the direction and in the presence of Bethel Born, PA-C. Electronically Signed: Placido Sou, ED Scribe. 02/09/16. 5:13 PM.   History   Chief Complaint Chief Complaint  Patient presents with  . Dental Pain    HPI HPI Comments: Adam Bush is a 35 y.o. male who presents to the Emergency Department complaining of a point of worsening, moderate, swelling to his right lower mouth x 1 day. Pt states he was evaluated for dental pain last week and was d/c with Amoxicillin and naprosyn which he confirms being compliant with and additionally has taken tylenol and applied Orajel as needed with relief. Pt states his dental pain has reduced and now presents only with palpation of the region and beginning yesterday noticed a point of worsening swelling and redness to the affected region. He is unsure of any drainage from the region. Pt denies any other associated symptoms at this time. He has 2 days of Amoxicillin therapy left and a dentist appointment next week.  HPI  Past Medical History:  Diagnosis Date  . Hypertension   . Obesity   . Obesity     There are no active problems to display for this patient.   History reviewed. No pertinent surgical history.    Home Medications    Prior to Admission medications   Medication Sig Start Date End Date Taking? Authorizing Provider  acetaminophen (TYLENOL) 500 MG tablet Take 2,000 mg by mouth daily as needed for headache.    Historical Provider, MD  amoxicillin (AMOXIL) 500 MG capsule Take 1 capsule (500 mg total) by mouth 3 (three) times daily. 02/02/16   Hope Orlene Och, NP  diclofenac (VOLTAREN) 50 MG EC tablet Take 1 tablet (50 mg total) by mouth 2 (two) times daily. 11/24/15   Hope Orlene Och, NP  hydroxypropyl  methylcellulose / hypromellose (ISOPTO TEARS / GONIOVISC) 2.5 % ophthalmic solution Place 2 drops into the right eye as needed for dry eyes. 06/03/15   Ace Gins Sam, PA-C  ibuprofen (ADVIL,MOTRIN) 800 MG tablet Take 1 tablet (800 mg total) by mouth every 8 (eight) hours as needed for mild pain or moderate pain. 01/12/16   Chase Picket Ward, PA-C  naproxen (NAPROSYN) 500 MG tablet Take 1 tablet (500 mg total) by mouth 2 (two) times daily. 02/02/16   Hope Orlene Och, NP  tiZANidine (ZANAFLEX) 4 MG tablet Take 1 tablet (4 mg total) by mouth every 6 (six) hours as needed for muscle spasms. 11/24/15   Hope Orlene Och, NP    Family History No family history on file.  Social History Social History  Substance Use Topics  . Smoking status: Former Smoker    Quit date: 06/03/2015  . Smokeless tobacco: Former Neurosurgeon  . Alcohol use Yes     Allergies   Review of patient's allergies indicates no known allergies.   Review of Systems Review of Systems  Constitutional: Negative for chills and fever.  HENT: Positive for dental problem and facial swelling.     Physical Exam Updated Vital Signs BP 135/80 (BP Location: Left Arm)   Pulse 88   Temp 98.4 F (36.9 C) (Oral)   Resp 18   Ht  (1.753 m)   Wt (!) 330 lb (149.7 kg)   SpO2 100%  BMI 48.73 kg/m   Physical Exam  Constitutional: He is oriented to person, place, and time. He appears well-developed and well-nourished.  HENT:  Head: Normocephalic.  Mouth/Throat: Uvula is midline, oropharynx is clear and moist and mucous membranes are normal. Abnormal dentition. Dental abscesses and dental caries present.    Eyes: EOM are normal.  Neck: Normal range of motion.  Pulmonary/Chest: Effort normal.  Abdominal: He exhibits no distension.  Musculoskeletal: Normal range of motion.  Neurological: He is alert and oriented to person, place, and time.  Psychiatric: He has a normal mood and affect.  Nursing note and vitals reviewed.  ED Treatments /  Results   Procedures Procedures   DIAGNOSTIC STUDIES: Oxygen Saturation is 100% on RA, normal by my interpretation.    COORDINATION OF CARE: 5:10 PM Discussed next steps with pt. Pt verbalized understanding and is agreeable with the plan.    Initial Impression / Assessment and Plan / ED Course  I have reviewed the triage vital signs and the nursing notes.  Pertinent labs & imaging results that were available during my care of the patient were reviewed by me and considered in my medical decision making (see chart for details).  Clinical Course    35 year old male who presents with a resolving dental abscess. It is already draining and patient states it is less painful and swollen than yesterday. Provided reassurance and advised to finish course of antibiotics and continue home therapies. He has a dentist appointment next week to have tooth pulled. Patient is NAD, non-toxic, with stable VS. Patient is informed of clinical course, understands medical decision making process, and agrees with plan. Opportunity for questions provided and all questions answered. Return precautions given.  I personally performed the services described in this documentation, which was scribed in my presence. The recorded information has been reviewed and is accurate.   Final Clinical Impressions(s) / ED Diagnoses   Final diagnoses:  Dental abscess      Bethel Born, PA-C 02/09/16 1815    Rolan Bucco, MD 02/09/16 2356

## 2016-03-02 ENCOUNTER — Encounter (HOSPITAL_COMMUNITY): Payer: Self-pay | Admitting: Emergency Medicine

## 2016-03-02 ENCOUNTER — Emergency Department (HOSPITAL_COMMUNITY)
Admission: EM | Admit: 2016-03-02 | Discharge: 2016-03-02 | Disposition: A | Discharge status: Home / Self Care | Payer: Self-pay | Attending: Emergency Medicine | Admitting: Emergency Medicine

## 2016-03-02 DIAGNOSIS — I10 Essential (primary) hypertension: Secondary | ICD-10-CM | POA: Insufficient documentation

## 2016-03-02 DIAGNOSIS — K0889 Other specified disorders of teeth and supporting structures: Secondary | ICD-10-CM | POA: Insufficient documentation

## 2016-03-02 DIAGNOSIS — Z87891 Personal history of nicotine dependence: Secondary | ICD-10-CM | POA: Insufficient documentation

## 2016-03-02 MED ORDER — PENICILLIN V POTASSIUM 500 MG PO TABS: 500.0000 mg | ORAL_TABLET | Freq: Four times a day (QID) | ORAL | 0 refills | Status: AC

## 2016-03-02 NOTE — ED Triage Notes (Signed)
Pt. reports worsening right lower molar pain with swelling onset last week unrelieved by OTC pain medication .

## 2016-03-02 NOTE — ED Notes (Signed)
Patient is A&Ox4 at this time.  Patient in no signs of distress.  Please see providers note for complete history and physical exam.  

## 2016-03-02 NOTE — ED Notes (Signed)
Patient Alert and oriented X4. Stable and ambulatory. Patient verbalized understanding of the discharge instructions.  Patient belongings were taken by the patient.  

## 2016-03-02 NOTE — ED Provider Notes (Signed)
[facility] Provider Note   CSN: [id number] Arrival date & time: 03/02/16  2117  By signing my name below, I, Freida Busmaniana Bush, attest that this documentation has been prepared under the direction and in the presence of non-physician practitioner, Cheri FowlerKayla Tobyn Osgood, PA-C. Electronically Signed: Freida Busmaniana Bush, Scribe. 03/02/2016. 9:51 PM.   History   Chief Complaint Chief Complaint  Patient presents with  . Dental Pain     The history is provided by the patient. No language interpreter was used.    HPI Comments:  Adam Bush is a 35 y.o. male who presents to the Emergency Department complaining of dental pain x a few days. Pt believes he has an abscess to the right lower gum. He was seen in the ED on 02/05/16 for the same and was discharged with Amoxicillin and Naproxen which he states relieved his symptoms. He states he was scheduled to have the tooth pulled a few weeks ago but had to reschedule for the end of this month. He denies fever, and neck pain. He has been taking tylenol with little relief. This is the pt's 4th visit in 2 months for dental pain.   Past Medical History:  Diagnosis Date  . Hypertension   . Obesity   . Obesity     There are no active problems to display for this patient.   History reviewed. No pertinent surgical history.   Home Medications    Prior to Admission medications   Medication Sig Start Date End Date Taking? Authorizing Provider  acetaminophen (TYLENOL) 500 MG tablet Take 2,000 mg by mouth daily as needed for headache.    Historical Provider, MD  amoxicillin (AMOXIL) 500 MG capsule Take 1 capsule (500 mg total) by mouth 3 (three) times daily. 02/02/16   Hope Orlene OchM Neese, NP  diclofenac (VOLTAREN) 50 MG EC tablet Take 1 tablet (50 mg total) by mouth 2 (two) times daily. 11/24/15   Hope Orlene OchM Neese, NP  hydroxypropyl methylcellulose / hypromellose (ISOPTO TEARS / GONIOVISC) 2.5 % ophthalmic solution Place 2 drops into the right eye as needed for dry  eyes. 06/03/15   Ace GinsSerena Y Sam, PA-C  ibuprofen (ADVIL,MOTRIN) 800 MG tablet Take 1 tablet (800 mg total) by mouth every 8 (eight) hours as needed for mild pain or moderate pain. 01/12/16   Chase PicketJaime Pilcher Ward, PA-C  naproxen (NAPROSYN) 500 MG tablet Take 1 tablet (500 mg total) by mouth 2 (two) times daily. 02/02/16   Hope Orlene OchM Neese, NP  penicillin v potassium (VEETID) 500 MG tablet Take 1 tablet (500 mg total) by mouth 4 (four) times daily. 03/02/16 03/09/16  Cheri FowlerKayla Clark Cuff, PA-C  tiZANidine (ZANAFLEX) 4 MG tablet Take 1 tablet (4 mg total) by mouth every 6 (six) hours as needed for muscle spasms. 11/24/15   Hope Orlene OchM Neese, NP    Family History No family history on file.  Social History Social History  Substance Use Topics  . Smoking status: Former Smoker    Quit date: 06/03/2015  . Smokeless tobacco: Former NeurosurgeonUser  . Alcohol use Yes     Allergies   Review of patient's allergies indicates no known allergies.   Review of Systems Review of Systems  Constitutional: Negative for fever.  HENT: Positive for dental problem.   Musculoskeletal: Negative for neck pain.   Physical Exam Updated Vital Signs BP 132/87 (BP Location: Right Arm)   Pulse 96   Temp 98.1 F (36.7 C) (Oral)   Resp 16   Ht 5\' 9"  (1.753 m)  Wt (!) 149.7 kg   SpO2 98%   BMI 48.73 kg/m   Physical Exam  Constitutional: He is oriented to person, place, and time. He appears well-developed and well-nourished.  HENT:  Head: Normocephalic and atraumatic.  Mouth/Throat: Uvula is midline, oropharynx is clear and moist and mucous membranes are normal. No trismus in the jaw. Abnormal dentition. Dental caries present.  Right 3rd lower molar impacted with no obvious dental abscess, minimal swelling  No tongue swelling or facial swelling.  Airway patent. Patient tolerating secretions without difficulty.   Eyes: Conjunctivae are normal.  Neck: Normal range of motion. Neck supple.  No evidence of Ludwigs angina.  Cardiovascular:  Normal rate, regular rhythm and normal heart sounds.   Pulmonary/Chest: Effort normal and breath sounds normal.  Abdominal: Bowel sounds are normal. He exhibits no distension.  Musculoskeletal:  Moves all extremities spontaneously.  Lymphadenopathy:    He has no cervical adenopathy.  Neurological: He is alert and oriented to person, place, and time.  Speech clear without dysarthria.   Skin: Skin is warm and dry.  Nursing note and vitals reviewed.    ED Treatments / Results  DIAGNOSTIC STUDIES:  Oxygen Saturation is 98% on RA, normal by my interpretation.    COORDINATION OF CARE:  9:45 PM Discussed treatment plan with pt at bedside and pt agreed to plan.  Labs (all labs ordered are listed, but only abnormal results are displayed) Labs Reviewed - No data to display  EKG  EKG Interpretation None       Radiology No results found.  Procedures Procedures (including critical care time)  Medications Ordered in ED Medications - No data to display   Initial Impression / Assessment and Plan / ED Course  I have reviewed the triage vital signs and the nursing notes.  Pertinent labs & imaging results that were available during my care of the patient were reviewed by me and considered in my medical decision making (see chart for details).  Clinical Course    Patient with dentalgia.  No abscess requiring immediate incision and drainage.  Exam not concerning for Ludwig's angina or pharyngeal abscess.  Will treat with PCN. Pt instructed to follow-up with dentist.  Discussed return precautions. Pt safe for discharge.   Final Clinical Impressions(s) / ED Diagnoses   Final diagnoses:  Pain, dental    New Prescriptions Discharge Medication List as of 03/02/2016  9:51 PM    START taking these medications   Details  penicillin v potassium (VEETID) 500 MG tablet Take 1 tablet (500 mg total) by mouth 4 (four) times daily., Starting Wed 03/02/2016, Until Wed 03/09/2016, Print          I personally performed the services described in this documentation, which was scribed in my presence. The recorded information has been reviewed and is accurate.     Cheri FowlerKayla Brienna Bass, PA-C 03/03/16 0151    Lavera Guiseana Duo Liu, MD 03/03/16 [phone]

## 2016-06-13 ENCOUNTER — Emergency Department (HOSPITAL_COMMUNITY): Payer: Self-pay

## 2016-06-13 ENCOUNTER — Encounter (HOSPITAL_COMMUNITY): Payer: Self-pay

## 2016-06-13 ENCOUNTER — Emergency Department (HOSPITAL_COMMUNITY)
Admission: EM | Admit: 2016-06-13 | Discharge: 2016-06-13 | Disposition: A | Discharge status: Home / Self Care | Payer: Self-pay | Attending: Emergency Medicine | Admitting: Emergency Medicine

## 2016-06-13 DIAGNOSIS — I10 Essential (primary) hypertension: Secondary | ICD-10-CM | POA: Insufficient documentation

## 2016-06-13 DIAGNOSIS — Z79899 Other long term (current) drug therapy: Secondary | ICD-10-CM | POA: Insufficient documentation

## 2016-06-13 DIAGNOSIS — G8929 Other chronic pain: Secondary | ICD-10-CM | POA: Insufficient documentation

## 2016-06-13 DIAGNOSIS — M25512 Pain in left shoulder: Secondary | ICD-10-CM | POA: Insufficient documentation

## 2016-06-13 DIAGNOSIS — Z87891 Personal history of nicotine dependence: Secondary | ICD-10-CM | POA: Insufficient documentation

## 2016-06-13 MED ORDER — NAPROXEN 500 MG PO TABS: 500.0000 mg | ORAL_TABLET | Freq: Two times a day (BID) | ORAL | 0 refills | Status: DC

## 2016-06-13 MED ORDER — DEXAMETHASONE SODIUM PHOSPHATE 10 MG/ML IJ SOLN
10.0000 mg | Freq: Once | INTRAMUSCULAR | Status: AC
  Administered 2016-06-13: 10 mg via INTRAMUSCULAR
  Filled 2016-06-13: qty 1

## 2016-06-13 NOTE — ED Provider Notes (Signed)
[facility] Provider Note   CSN: [id number] Arrival date & time: 06/13/16  1423  By signing my name below, I, Placido SouLogan Joldersma, attest that this documentation has been prepared under the direction and in the presence of Cheri FowlerKayla Karly Pitter, PA-C. Electronically Signed: Placido SouLogan Joldersma, ED Scribe. 06/13/16. 3:20 PM.   History   Chief Complaint Chief Complaint  Patient presents with  . Shoulder Pain    HPI HPI Comments: Adam Bush is a 35 y.o. male who is right hand dominant presents to the Emergency Department complaining of chronic, intermittent, mild, left shoulder pain x 1 year which worsened beginning last night. 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 He states his symptoms typically alleviate with time and rest and is unsure of any worsening factors. Pt now works in an office and is no longer making similar movements with his extremities. He has not taken anything for his pain. He denies numbness, weakness, CP, SOB or other associated symptoms at this time.   The history is provided by the patient. No language interpreter was used.    Past Medical History:  Diagnosis Date  . Hypertension   . Obesity   . Obesity     There are no active problems to display for this patient.   History reviewed. No pertinent surgical history.     Home Medications    Prior to Admission medications   Medication Sig Start Date End Date Taking? Authorizing Provider  acetaminophen (TYLENOL) 500 MG tablet Take 2,000 mg by mouth daily as needed for headache.    Historical Provider, MD  amoxicillin (AMOXIL) 500 MG capsule Take 1 capsule (500 mg total) by mouth 3 (three) times daily. 02/02/16   Hope Orlene OchM Neese, NP  diclofenac (VOLTAREN) 50 MG EC tablet Take 1 tablet (50 mg total) by mouth 2 (two) times daily. 11/24/15   Hope Orlene OchM Neese, NP  hydroxypropyl methylcellulose / hypromellose (ISOPTO TEARS /  GONIOVISC) 2.5 % ophthalmic solution Place 2 drops into the right eye as needed for dry eyes. 06/03/15   Ace GinsSerena Y Sam, PA-C  ibuprofen (ADVIL,MOTRIN) 800 MG tablet Take 1 tablet (800 mg total) by mouth every 8 (eight) hours as needed for mild pain or moderate pain. 01/12/16   Chase PicketJaime Pilcher Ward, PA-C  naproxen (NAPROSYN) 500 MG tablet Take 1 tablet (500 mg total) by mouth 2 (two) times daily. 06/13/16   Cheri FowlerKayla Ilyanna Baillargeon, PA-C  tiZANidine (ZANAFLEX) 4 MG tablet Take 1 tablet (4 mg total) by mouth every 6 (six) hours as needed for muscle spasms. 11/24/15   Hope Orlene OchM Neese, NP    Family History History reviewed. No pertinent family history.  Social History Social History  Substance Use Topics  . Smoking status: Former Smoker    Quit date: 06/03/2015  . Smokeless tobacco: Former NeurosurgeonUser  . Alcohol use Yes     Allergies   Patient has no known allergies.   Review of Systems Review of Systems  Respiratory: Negative for shortness of breath.   Cardiovascular: Negative for chest pain.  Musculoskeletal: Positive for arthralgias.  Skin: Negative for color change.  Neurological: Negative for weakness and numbness.  All other systems reviewed and are negative.  Physical Exam Updated Vital Signs BP 132/82 (BP Location: Right Arm)   Pulse 85   Temp 98.3 F (36.8 C) (Oral)   Resp 16   SpO2 98%   Physical Exam  Constitutional:  He is oriented to person, place, and time. He appears well-developed and well-nourished.  HENT:  Head: Normocephalic and atraumatic.  Right Ear: External ear normal.  Left Ear: External ear normal.  Eyes: Conjunctivae are normal. No scleral icterus.  Neck: No tracheal deviation present.  Cardiovascular:  Pulses:      Radial pulses are 2+ on the right side, and 2+ on the left side.  Pulmonary/Chest: Effort normal. No respiratory distress.  Abdominal: He exhibits no distension.  Musculoskeletal: Normal range of motion. He exhibits no tenderness.  Left shoulder: No bony  tenderness.  FAROM in flexion, extension, abduction/adduction, and internal/external rotation.  Pain with internal rotation and abduction.  Neurological: He is alert and oriented to person, place, and time.  5/5 strength in bilateral upper extremities. Sensation intact to light touch.   Skin: Skin is warm and dry.  Psychiatric: He has a normal mood and affect. His behavior is normal.   ED Treatments / Results  Labs (all labs ordered are listed, but only abnormal results are displayed) Labs Reviewed - No data to display  EKG  EKG Interpretation None       Radiology Dg Shoulder Left  Result Date: 06/13/2016 CLINICAL DATA:  One year of persistent left shoulder pain which waxes and wanes with increased symptoms more recently. EXAM: LEFT SHOULDER - 2+ VIEW COMPARISON:  None in PACs FINDINGS: The bones are subjectively adequately mineralized. The glenohumeral and AC joint spaces are preserved. No acute fracture or dislocation is demonstrated. The subacromial subdeltoid space is normal. IMPRESSION: There is no acute or chronic bony abnormality of the left shoulder. Given the patient's chronic symptoms, MRI would be a useful next imaging step. Electronically Signed   By: David  SwazilandJordan M.D.   On: 06/13/2016 15:05    Procedures Procedures  DIAGNOSTIC STUDIES: Oxygen Saturation is 98% on RA, normal by my interpretation.    COORDINATION OF CARE: 3:19 PM Discussed next steps with pt. Pt verbalized understanding and is agreeable with the plan.    Medications Ordered in ED Medications  dexamethasone (DECADRON) injection 10 mg (not administered)     Initial Impression / Assessment and Plan / ED Course  I have reviewed the triage vital signs and the nursing notes.  Pertinent labs & imaging results that were available during my care of the patient were reviewed by me and considered in my medical decision making (see chart for details).  Clinical Course    Patient X-Ray negative for  obvious fracture or dislocation.  Likely rotator cuff. Pt advised to follow up with orthopedics for possible outpatient MRI. Conservative therapy recommended and discussed. Patient will be discharged home & is agreeable with above plan. Returns precautions discussed. Pt appears safe for discharge.   Final Clinical Impressions(s) / ED Diagnoses   Final diagnoses:  Chronic left shoulder pain    New Prescriptions New Prescriptions   NAPROXEN (NAPROSYN) 500 MG TABLET    Take 1 tablet (500 mg total) by mouth 2 (two) times daily.   I personally performed the services described in this documentation, which was scribed in my presence. The recorded information has been reviewed and is accurate.    Cheri FowlerKayla Intisar Claudio, PA-C 06/13/16 1529    Raeford RazorStephen Kohut, MD 06/21/16 [phone]

## 2016-06-13 NOTE — ED Triage Notes (Signed)
Pt reports left shoulder pain for more than one year. Pt states he believes the pain is due to an injury from work last year. Pt denies any sort of chest pain. No distress noted.

## 2016-06-13 NOTE — Discharge Instructions (Signed)
Start taking Naproxen twice daily for pain.  Follow up with orthopedics for persistent symptoms for an outpatient MRI for further evaluation.  Return to the ED for sudden worsening pain, weakness numbness, or any new or concerning symptoms.

## 2016-06-13 NOTE — ED Notes (Signed)
Declined W/C at D/C and was escorted to lobby by RN. 

## 2016-06-27 ENCOUNTER — Emergency Department (HOSPITAL_COMMUNITY)
Admission: EM | Admit: 2016-06-27 | Discharge: 2016-06-27 | Disposition: A | Discharge status: Home / Self Care | Payer: Self-pay | Attending: Emergency Medicine | Admitting: Emergency Medicine

## 2016-06-27 ENCOUNTER — Encounter (HOSPITAL_COMMUNITY): Payer: Self-pay

## 2016-06-27 DIAGNOSIS — I10 Essential (primary) hypertension: Secondary | ICD-10-CM | POA: Insufficient documentation

## 2016-06-27 DIAGNOSIS — E739 Lactose intolerance, unspecified: Secondary | ICD-10-CM | POA: Insufficient documentation

## 2016-06-27 DIAGNOSIS — R1030 Lower abdominal pain, unspecified: Secondary | ICD-10-CM | POA: Insufficient documentation

## 2016-06-27 DIAGNOSIS — F17210 Nicotine dependence, cigarettes, uncomplicated: Secondary | ICD-10-CM | POA: Insufficient documentation

## 2016-06-27 DIAGNOSIS — R197 Diarrhea, unspecified: Secondary | ICD-10-CM | POA: Insufficient documentation

## 2016-06-27 DIAGNOSIS — R109 Unspecified abdominal pain: Secondary | ICD-10-CM

## 2016-06-27 LAB — URINALYSIS, ROUTINE W REFLEX MICROSCOPIC
Bilirubin Urine: NEGATIVE
Glucose, UA: NEGATIVE mg/dL
Hgb urine dipstick: NEGATIVE
Ketones, ur: NEGATIVE mg/dL
LEUKOCYTES UA: NEGATIVE
Nitrite: NEGATIVE
PROTEIN: NEGATIVE mg/dL
Specific Gravity, Urine: 1.02 (ref 1.005–1.030)
pH: 6.5 (ref 5.0–8.0)

## 2016-06-27 LAB — COMPREHENSIVE METABOLIC PANEL
ALT: 40 U/L (ref 17–63)
ANION GAP: 8 (ref 5–15)
AST: 41 U/L (ref 15–41)
Albumin: 3.7 g/dL (ref 3.5–5.0)
Alkaline Phosphatase: 64 U/L (ref 38–126)
BUN: 16 mg/dL (ref 6–20)
CALCIUM: 9.2 mg/dL (ref 8.9–10.3)
CHLORIDE: 105 mmol/L (ref 101–111)
CO2: 23 mmol/L (ref 22–32)
CREATININE: 0.98 mg/dL (ref 0.61–1.24)
Glucose, Bld: 90 mg/dL (ref 65–99)
Potassium: 4.1 mmol/L (ref 3.5–5.1)
Sodium: 136 mmol/L (ref 135–145)
Total Bilirubin: 0.7 mg/dL (ref 0.3–1.2)
Total Protein: 7.7 g/dL (ref 6.5–8.1)

## 2016-06-27 LAB — CBC
HCT: 42 % (ref 39.0–52.0)
Hemoglobin: 14.2 g/dL (ref 13.0–17.0)
MCH: 29.2 pg (ref 26.0–34.0)
MCHC: 33.8 g/dL (ref 30.0–36.0)
MCV: 86.4 fL (ref 78.0–100.0)
PLATELETS: 269 10*3/uL (ref 150–400)
RBC: 4.86 MIL/uL (ref 4.22–5.81)
RDW: 14.8 % (ref 11.5–15.5)
WBC: 9.9 10*3/uL (ref 4.0–10.5)

## 2016-06-27 LAB — LIPASE, BLOOD: LIPASE: 98 U/L — AB (ref 11–51)

## 2016-06-27 NOTE — ED Notes (Signed)
Pt denies concerns with dc 

## 2016-06-27 NOTE — ED Provider Notes (Signed)
[facility] Provider Note   CSN: [id number] Arrival date & time: 06/27/16  1846     History   Chief Complaint Chief Complaint  Patient presents with  . Abdominal Pain    HPI Adam Bush is a 35 y.o. male.  HPI   Patient is a 35 year old male with history of hypertension who presents the ED with complaint of lower abdominal cramping. Patient reports having intermittent episodes of mild lower abdominal discomfort and cramping that started 5 days ago. He reports the symptoms occurred products. He reports having similar episodes in the past after consuming dairy products. Patient also reports having a few episodes of nonbloody diarrhea with the episodes. Patient reports he was celebrating his birthday this weekend and reports consuming multiple dairy products including multiple cheese dips. Patient reports he called out of work today due to his abdominal discomfort and diarrhea. He reports feeling significantly better this afternoon and he called his work to let them know that he was coming in tomorrow and they requested he be evaluated prior to returning. Patient denies any pain or complaints at this time. Denies fever, chills, headache, chest pain, shortness of breath, cough, nausea, vomiting, urinary symptoms, blood in urine or stool. Denies history of abdominal surgeries. Patient denies taking any medications for her symptoms. Denies any recent hospitalizations or antibiotic use. Denies any recent travel outside the [place], drinking sick contacts.  Past Medical History:  Diagnosis Date  . Hypertension   . Obesity   . Obesity     There are no active problems to display for this patient.   History reviewed. No pertinent surgical history.     Home Medications    Prior to Admission medications   Medication Sig Start Date End Date Taking? Authorizing Provider  acetaminophen (TYLENOL) 500 MG tablet Take 2,000 mg by mouth daily as needed for headache.   Yes Historical  Provider, MD  diclofenac (VOLTAREN) 50 MG EC tablet Take 1 tablet (50 mg total) by mouth 2 (two) times daily. Patient not taking: Reported on 06/27/2016 11/24/15   Janne NapoleonHope M Neese, NP  hydroxypropyl methylcellulose / hypromellose (ISOPTO TEARS / GONIOVISC) 2.5 % ophthalmic solution Place 2 drops into the right eye as needed for dry eyes. Patient not taking: Reported on 06/27/2016 06/03/15   Ace GinsSerena Y Sam, PA-C  ibuprofen (ADVIL,MOTRIN) 800 MG tablet Take 1 tablet (800 mg total) by mouth every 8 (eight) hours as needed for mild pain or moderate pain. Patient not taking: Reported on 06/27/2016 01/12/16   [facility] Center[facility]ime Pilcher Ward, PA-C  naproxen (NAPROSYN) 500 MG tablet Take 1 tablet (500 mg total) by mouth 2 (two) times daily. Patient not taking: Reported on 06/27/2016 06/13/16   Cheri FowlerKayla Rose, PA-C  tiZANidine (ZANAFLEX) 4 MG tablet Take 1 tablet (4 mg total) by mouth every 6 (six) hours as needed for muscle spasms. Patient not taking: Reported on 06/27/2016 11/24/15   Janne NapoleonHope M Neese, NP    Family History History reviewed. No pertinent family history.  Social History Social History  Substance Use Topics  . Smoking status: Current Some Day Smoker    Types: Cigarettes  . Smokeless tobacco: Former NeurosurgeonUser  . Alcohol use Yes     Comment: occ     Allergies   Patient has no known allergies.   Review of Systems Review of Systems  Gastrointestinal: Positive for abdominal pain and diarrhea.  All other systems reviewed and are negative.    Physical Exam Updated Vital Signs BP 139/92   Pulse  80   Temp 98.2 F (36.8 C) (Oral)   Resp 18   Ht 5\' 9"  (1.753 m)   Wt (!) 145.2 kg   SpO2 99%   BMI 47.26 kg/m   Physical Exam  Constitutional: He is oriented to person, place, and time. He appears well-developed and well-nourished. No distress.  Well, nontoxic appearing obese male  HENT:  Head: Normocephalic and atraumatic.  Mouth/Throat: Uvula is midline, oropharynx is clear and moist and mucous membranes  are normal. No oropharyngeal exudate, posterior oropharyngeal edema, posterior oropharyngeal erythema or tonsillar abscesses. No tonsillar exudate.  Eyes: Conjunctivae and EOM are normal. Right eye exhibits no discharge. Left eye exhibits no discharge. No scleral icterus.  Neck: Normal range of motion. Neck supple.  Cardiovascular: Normal rate, regular rhythm, normal heart sounds and intact distal pulses.   Pulmonary/Chest: Effort normal and breath sounds normal. No respiratory distress. He has no wheezes. He has no rales. He exhibits no tenderness.  Abdominal: Soft. Bowel sounds are normal. He exhibits no distension and no mass. There is no tenderness. There is no rigidity, no rebound, no guarding, no CVA tenderness and negative Murphy's sign.  No CVA tenderness  Musculoskeletal: Normal range of motion. He exhibits no edema.  Neurological: He is alert and oriented to person, place, and time.  Skin: Skin is warm and dry. He is not diaphoretic.  Nursing note and vitals reviewed.    ED Treatments / Results  Labs (all labs ordered are listed, but only abnormal results are displayed) Labs Reviewed  LIPASE, BLOOD - Abnormal; Notable for the following:       Result Value   Lipase 98 (*)    All other components within normal limits  COMPREHENSIVE METABOLIC PANEL  CBC  URINALYSIS, ROUTINE W REFLEX MICROSCOPIC    EKG  EKG Interpretation None       Radiology No results found.  Procedures Procedures (including critical care time)  Medications Ordered in ED Medications - No data to display   Initial Impression / Assessment and Plan / ED Course  I have reviewed the triage vital signs and the nursing notes.  Pertinent labs & imaging results that were available during my care of the patient were reviewed by me and considered in my medical decision making (see chart for details).  Clinical Course     Patient presents with mild intermittent lower abdominal cramping and associated  nonbloody diarrhea over the past few days. He reports symptoms typically occur hours after eating dairy products. Reports history of similar episodes. Denies fever or vomiting. Patient denies having any pain or complaints at this time and reports he came to the ED to be cleared to return to work tomorrow. VSS. Exam unremarkable, abdominal exam benign. Labs revealed lipase mildly elevated at 98. Remaining labs and urine unremarkable. No concern for pancreatitis or other acute abdominal etiology resulting in mildly elevated lipase. Patient's symptoms are likely due to lactose intolerance. Discussed results and plan for discharge with patient. Advised patient to refrain from eating dairy products to prevent sxs. Advised patient to follow up with PCP within the next week to have blood work rechecked. Discussed return precautions.  Final Clinical Impressions(s) / ED Diagnoses   Final diagnoses:  Abdominal pain, unspecified abdominal location  Diarrhea, unspecified type  Lactose intolerance in adult    New Prescriptions New Prescriptions   No medications on file     Barrett Henleicole Elizabeth Manika Hast, [place] JerseyPA-C 06/27/16 2259    Rolan BuccoMelanie Belfi, MD 06/27/16 [phone]

## 2016-06-27 NOTE — Discharge Instructions (Signed)
I recommend refraining eating any dairy products to prevent any further episodes of your abdominal pain and diarrhea. Your lipase was mildly elevated in the emergency department today, lipase 98. I recommend following up with your primary care provider within the next week to have your blood work rechecked. Please return to the Emergency Department if symptoms worsen or new onset of fever, chills, chest pain, difficulty breathing, abdominal pain, nausea, vomiting, diarrhea, unable to keep fluids down, blood in urine or stool.

## 2016-06-27 NOTE — ED Triage Notes (Signed)
Pt endorses intermittent lower mid abd pain since last Thursday with 1 episode of diarrhea this morning. Pt states "I am feeling a little better now but my job ordered me to come get evaluated" Pt denies pain at this time and denies n/v.

## 2016-12-21 ENCOUNTER — Encounter (HOSPITAL_COMMUNITY): Payer: Self-pay

## 2016-12-21 DIAGNOSIS — I10 Essential (primary) hypertension: Secondary | ICD-10-CM | POA: Insufficient documentation

## 2016-12-21 DIAGNOSIS — F17210 Nicotine dependence, cigarettes, uncomplicated: Secondary | ICD-10-CM | POA: Insufficient documentation

## 2016-12-21 DIAGNOSIS — R1013 Epigastric pain: Secondary | ICD-10-CM | POA: Insufficient documentation

## 2016-12-21 NOTE — ED Triage Notes (Signed)
Pt endorses abd discomfort x several days since he began drinking almond milk. Pt denies urinary sx,  N/v/d. VSS.

## 2016-12-22 ENCOUNTER — Emergency Department (HOSPITAL_COMMUNITY)
Admission: EM | Admit: 2016-12-22 | Discharge: 2016-12-22 | Disposition: A | Discharge status: Home / Self Care | Payer: Self-pay | Attending: Emergency Medicine | Admitting: Emergency Medicine

## 2016-12-22 DIAGNOSIS — R1013 Epigastric pain: Secondary | ICD-10-CM

## 2016-12-22 MED ORDER — GI COCKTAIL ~~LOC~~: 30.0000 mL | Freq: Once | ORAL | Status: DC

## 2016-12-22 NOTE — ED Notes (Signed)
Pt stated that he feels fine and doesn't want to wait for the blood results. Refuses medication as well. MD aware. Pt will be discharged.

## 2016-12-22 NOTE — Discharge Instructions (Signed)
Prilosec 20 mg twice daily for the next 2 weeks.  Return to the emergency department if you develop high fevers, worsening pain, bloody stool or vomit, or other new and concerning symptoms.

## 2016-12-22 NOTE — ED Provider Notes (Signed)
[facility] Provider Note   CSN: [id number] Arrival date & time: 12/21/16  2015  By signing my name below, I, Cynda AcresHailei Fulton, attest that this documentation has been prepared under the direction and in the presence of Geoffery Lyonselo, Juliette Standre, MD. Electronically Signed: Cynda AcresHailei Fulton, Scribe. 12/22/16. 1:51 AM.  History   Chief Complaint Chief Complaint  Patient presents with  . Abdominal Pain   HPI Comments: Adam Bush is a 36 y.o. male with a history of hypertension, who presents to the Emergency Department complaining of sudden-onset, constant epigastric abdominal paint that began one week ago. Patient states he has had abdominal pain ever since he transitioned from whole milk to almond milk. Patient states he has been drinking almond milk for one week, but his symptoms have persisted. Patient states as the day began to progress his symptoms began to improve, but his supervisor advised him to come here for evaluation. Patient reports associated diarrhea. No modifying or aggrevating factors indicated. Patient describes his abdominal pain as pressured, cramping. Patient denies any history of abdominal surgeries. Patient denies any fever, chills, nausea, vomiting, hematochezia, flank pain, dysuria, hematuria, or any additional symptoms.   The history is provided by the patient. No language interpreter was used.    Past Medical History:  Diagnosis Date  . Hypertension   . Obesity   . Obesity     There are no active problems to display for this patient.   History reviewed. No pertinent surgical history.     Home Medications    Prior to Admission medications   Medication Sig Start Date End Date Taking? Authorizing Provider  acetaminophen (TYLENOL) 500 MG tablet Take 2,000 mg by mouth daily as needed for headache.    [provider]  diclofenac (VOLTAREN) 50 MG EC tablet Take 1 tablet (50 mg total) by mouth 2 (two) times daily. Patient not taking: Reported on  06/27/2016 11/24/15   Janne NapoleonNeese, Hope M, NP  hydroxypropyl methylcellulose / hypromellose (ISOPTO TEARS / GONIOVISC) 2.5 % ophthalmic solution Place 2 drops into the right eye as needed for dry eyes. Patient not taking: Reported on 06/27/2016 06/03/15   Sam, Ace GinsSerena Y, PA-C  ibuprofen (ADVIL,MOTRIN) 800 MG tablet Take 1 tablet (800 mg total) by mouth every 8 (eight) hours as needed for mild pain or moderate pain. Patient not taking: Reported on 06/27/2016 01/12/16   Ward, Chase PicketJaime Pilcher, PA-C  naproxen (NAPROSYN) 500 MG tablet Take 1 tablet (500 mg total) by mouth 2 (two) times daily. Patient not taking: Reported on 06/27/2016 06/13/16   Cheri Fowlerose, Kayla, PA-C  tiZANidine (ZANAFLEX) 4 MG tablet Take 1 tablet (4 mg total) by mouth every 6 (six) hours as needed for muscle spasms. Patient not taking: Reported on 06/27/2016 11/24/15   Janne NapoleonNeese, Hope M, NP    Family History History reviewed. No pertinent family history.  Social History Social History  Substance Use Topics  . Smoking status: Current Some Day Smoker    Types: Cigarettes  . Smokeless tobacco: Former NeurosurgeonUser  . Alcohol use Yes     Comment: occ     Allergies   Patient has no known allergies.   Review of Systems Review of Systems  Constitutional: Negative for chills and fever.  Gastrointestinal: Positive for abdominal pain and diarrhea. Negative for nausea and vomiting.  Genitourinary: Negative for dysuria, flank pain and hematuria.  All other systems reviewed and are negative.    Physical Exam Updated Vital Signs BP 116/84 (BP Location: Right Arm)   Pulse 81  Temp 98.6 F (37 C) (Oral)   Resp 16   Ht 5' 9.5" (1.765 m)   Wt (!) 324 lb 3 oz (147.1 kg)   SpO2 98%   BMI 47.19 kg/m   Physical Exam  Constitutional: He is oriented to person, place, and time. He appears well-developed and well-nourished.  HENT:  Head: Normocephalic and atraumatic.  Mouth/Throat: Oropharynx is clear and moist.  Eyes: Pupils are equal, round, and  reactive to light.  Neck: Normal range of motion. Neck supple.  Cardiovascular: Normal rate and regular rhythm.   No murmur heard. Pulmonary/Chest: Effort normal and breath sounds normal. No respiratory distress. He has no wheezes. He has no rales.  Abdominal: Soft. Bowel sounds are normal. He exhibits no distension. There is no tenderness. There is no rebound and no guarding.  Mild TTP in the epigastrium.   Musculoskeletal: Normal range of motion.  Neurological: He is alert and oriented to person, place, and time.  Skin: Skin is warm and dry.  Psychiatric: He has a normal mood and affect.  Nursing note and vitals reviewed.    ED Treatments / Results  DIAGNOSTIC STUDIES: Oxygen Saturation is 98% on RA, normal by my interpretation.    COORDINATION OF CARE: 1:50 AM Discussed treatment plan with pt at bedside and pt agreed to plan.   Labs (all labs ordered are listed, but only abnormal results are displayed) Labs Reviewed - No data to display  EKG  EKG Interpretation None       Radiology No results found.  Procedures Procedures (including critical care time)  Medications Ordered in ED Medications - No data to display   Initial Impression / Assessment and Plan / ED Course  I have reviewed the triage vital signs and the nursing notes.  Pertinent labs & imaging results that were available during my care of the patient were reviewed by me and considered in my medical decision making (see chart for details).  Patient presents with complaints of epigastric discomfort that he believes is related to drinking almond milk. He believes he is lactose intolerant and that is why he began drinking this. The symptoms have nearly resolved prior to arrival. My intentions were to perform laboratory studies to rule out the possibility of his gallbladder as the culprit. With the nurse was going to draw his labs, he declined these studies and stated that he wanted to go home. Request was  granted. He was directed that he could return at any time if you desire completion of his workup.  Final Clinical Impressions(s) / ED Diagnoses   Final diagnoses:  None    New Prescriptions New Prescriptions   No medications on file   I personally performed the services described in this documentation, which was scribed in my presence. The recorded information has been reviewed and is accurate.        Geoffery Lyons, MD 12/22/16 [phone]

## 2016-12-22 NOTE — ED Notes (Signed)
ED Provider at bedside. 

## 2016-12-27 ENCOUNTER — Emergency Department (HOSPITAL_COMMUNITY)
Admission: EM | Admit: 2016-12-27 | Discharge: 2016-12-27 | Disposition: A | Discharge status: Home / Self Care | Payer: Self-pay | Attending: Emergency Medicine | Admitting: Emergency Medicine

## 2016-12-27 ENCOUNTER — Encounter (HOSPITAL_COMMUNITY): Payer: Self-pay

## 2016-12-27 DIAGNOSIS — I10 Essential (primary) hypertension: Secondary | ICD-10-CM | POA: Insufficient documentation

## 2016-12-27 DIAGNOSIS — B9789 Other viral agents as the cause of diseases classified elsewhere: Secondary | ICD-10-CM

## 2016-12-27 DIAGNOSIS — J069 Acute upper respiratory infection, unspecified: Secondary | ICD-10-CM | POA: Insufficient documentation

## 2016-12-27 DIAGNOSIS — Z79899 Other long term (current) drug therapy: Secondary | ICD-10-CM | POA: Insufficient documentation

## 2016-12-27 DIAGNOSIS — F17210 Nicotine dependence, cigarettes, uncomplicated: Secondary | ICD-10-CM | POA: Insufficient documentation

## 2016-12-27 MED ORDER — BENZONATATE 100 MG PO CAPS: 100.0000 mg | ORAL_CAPSULE | Freq: Three times a day (TID) | ORAL | 0 refills | Status: DC

## 2016-12-27 NOTE — ED Triage Notes (Signed)
Pt presents with 2 day h/o productive cough with green phlegm, nasal drainage that is clear.  Pt denies fever, reports frontal headache yesterday, denies at present.  Kids at home with cough.

## 2016-12-27 NOTE — ED Provider Notes (Signed)
[facility] Provider Note    By signing my name below, I, Earmon PhoenixJennifer Waddell, attest that this documentation has been prepared under the direction and in the presence of [facility] Center[facility] Neese, [place]NP. Electronically Signed: Earmon PhoenixJennifer Waddell, ED Scribe. 12/27/16. 4:26 PM.    History   Chief Complaint Chief Complaint  Patient presents with  . Cough   HPI  Adam MasonJeremiah Bush is an obese 36 y.o. male who presents to the Emergency Department complaining of a productive cough green mucous that began yesterday. He reports associated HA, congestion, clear rhinorrhea. He has taken Aleve for his symptoms with no significant relief. He states his children have had similar symptoms at home. There are no modifying factors noted. He denies fever, chills, nausea, vomiting, watery eyes, otalgia, sore throat, SOB. He states he smokes occasionally. He reports mild seasonal allergies.    Past Medical History:  Diagnosis Date  . Hypertension   . Obesity   . Obesity     There are no active problems to display for this patient.   History reviewed. No pertinent surgical history.     Home Medications    Prior to Admission medications   Medication Sig Start Date End Date Taking? Authorizing Provider  acetaminophen (TYLENOL) 500 MG tablet Take 2,000 mg by mouth daily as needed for headache.    [provider]  benzonatate (TESSALON) 100 MG capsule Take 1 capsule (100 mg total) by mouth every 8 (eight) hours. 12/27/16   Janne NapoleonNeese, Hope M, NP  hydroxypropyl methylcellulose / hypromellose (ISOPTO TEARS / GONIOVISC) 2.5 % ophthalmic solution Place 2 drops into the right eye as needed for dry eyes. Patient not taking: Reported on 06/27/2016 06/03/15   Sam, Ace GinsSerena Y, PA-C  ibuprofen (ADVIL,MOTRIN) 800 MG tablet Take 1 tablet (800 mg total) by mouth every 8 (eight) hours as needed for mild pain or moderate pain. Patient not taking: Reported on 06/27/2016 01/12/16   Ward, Chase PicketJaime Pilcher, PA-C  naproxen  (NAPROSYN) 500 MG tablet Take 1 tablet (500 mg total) by mouth 2 (two) times daily. Patient not taking: Reported on 06/27/2016 06/13/16   Cheri Fowlerose, Kayla, PA-C  tiZANidine (ZANAFLEX) 4 MG tablet Take 1 tablet (4 mg total) by mouth every 6 (six) hours as needed for muscle spasms. Patient not taking: Reported on 06/27/2016 11/24/15   Janne NapoleonNeese, Hope M, NP    Family History History reviewed. No pertinent family history.  Social History Social History  Substance Use Topics  . Smoking status: Current Some Day Smoker    Types: Cigarettes  . Smokeless tobacco: Former NeurosurgeonUser  . Alcohol use Yes     Comment: occ     Allergies   Patient has no known allergies.   Review of Systems Review of Systems  Constitutional: Negative for chills and fever.  HENT: Positive for congestion and rhinorrhea. Negative for ear pain and sore throat.   Respiratory: Negative for shortness of breath.   Gastrointestinal: Negative for nausea and vomiting.  Neurological: Positive for headaches.     Physical Exam Updated Vital Signs BP (!) 143/96   Pulse 80   Temp 98.2 F (36.8 C) (Oral)   Resp 18   Ht 5' 9.5" (1.765 m)   Wt (!) 325 lb (147.4 kg)   SpO2 96%   BMI 47.31 kg/m   Physical Exam  Constitutional: He is oriented to person, place, and time. No distress.  obese  HENT:  Head: Normocephalic.  Right Ear: Tympanic membrane and ear canal normal.  Left Ear: Tympanic  membrane and ear canal normal.  Nose: Mucosal edema and rhinorrhea present.  Mouth/Throat: Uvula is midline, oropharynx is clear and moist and mucous membranes are normal.  Eyes: Conjunctivae and EOM are normal.  Sclera clear bilaterally.  Neck: Neck supple.  Cardiovascular: Normal rate and regular rhythm.   Pulmonary/Chest: Effort normal. No respiratory distress. He has no wheezes. He has no rales.  Abdominal: Soft. There is no tenderness.  Musculoskeletal: Normal range of motion.  Lymphadenopathy:    He has no cervical adenopathy.    Neurological: He is alert and oriented to person, place, and time. No cranial nerve deficit.  Skin: Skin is warm and dry.  Psychiatric: He has a normal mood and affect. His behavior is normal.  Nursing note and vitals reviewed.    ED Treatments / Results  DIAGNOSTIC STUDIES: Oxygen Saturation is 96% on RA, adequate by my interpretation.   COORDINATION OF CARE: 4:24 PM- Will prescribe Tessalon. Recommended humidifier. Pt verbalizes understanding and agrees to plan.  Medications - No data to display  Labs (all labs ordered are listed, but only abnormal results are displayed) Labs Reviewed - No data to display Radiology No results found.  Procedures Procedures (including critical care time)  Medications Ordered in ED Medications - No data to display   Initial Impression / Assessment and Plan / ED Course  I have reviewed the triage vital signs and the nursing notes.  Pt symptoms consistent with URI. Lungs clear to ascultation. Pt will be discharged with symptomatic treatment. Discussed return precautions. Pt is hemodynamically stable & in NAD prior to discharge.   Final Clinical Impressions(s) / ED Diagnoses   Final diagnoses:  Viral URI with cough    New Prescriptions New Prescriptions   BENZONATATE (TESSALON) 100 MG CAPSULE    Take 1 capsule (100 mg total) by mouth every 8 (eight) hours.   I personally performed the services described in this documentation, which was scribed in my presence. The recorded information has been reviewed and is accurate.    Kerrie Buffalo Lang[place], [place] 12/27/16 1635    Tegeler, Canary Brim, MD 12/28/16 [phone]

## 2016-12-27 NOTE — ED Notes (Signed)
See EDP secondary assessment.  

## 2017-01-30 ENCOUNTER — Encounter (HOSPITAL_COMMUNITY): Payer: Self-pay | Admitting: *Deleted

## 2017-01-30 ENCOUNTER — Emergency Department (HOSPITAL_COMMUNITY)
Admission: EM | Admit: 2017-01-30 | Discharge: 2017-01-30 | Disposition: A | Discharge status: Home / Self Care | Payer: Self-pay | Attending: Emergency Medicine | Admitting: Emergency Medicine

## 2017-01-30 DIAGNOSIS — R1030 Lower abdominal pain, unspecified: Secondary | ICD-10-CM | POA: Insufficient documentation

## 2017-01-30 DIAGNOSIS — I10 Essential (primary) hypertension: Secondary | ICD-10-CM | POA: Insufficient documentation

## 2017-01-30 DIAGNOSIS — R197 Diarrhea, unspecified: Secondary | ICD-10-CM | POA: Insufficient documentation

## 2017-01-30 DIAGNOSIS — F17210 Nicotine dependence, cigarettes, uncomplicated: Secondary | ICD-10-CM | POA: Insufficient documentation

## 2017-01-30 DIAGNOSIS — R109 Unspecified abdominal pain: Secondary | ICD-10-CM

## 2017-01-30 LAB — CBC
HEMATOCRIT: 44 % (ref 39.0–52.0)
HEMOGLOBIN: 14.1 g/dL (ref 13.0–17.0)
MCH: 28.1 pg (ref 26.0–34.0)
MCHC: 32 g/dL (ref 30.0–36.0)
MCV: 87.6 fL (ref 78.0–100.0)
Platelets: 253 10*3/uL (ref 150–400)
RBC: 5.02 MIL/uL (ref 4.22–5.81)
RDW: 15 % (ref 11.5–15.5)
WBC: 8.3 10*3/uL (ref 4.0–10.5)

## 2017-01-30 LAB — COMPREHENSIVE METABOLIC PANEL
ALBUMIN: 3.5 g/dL (ref 3.5–5.0)
ALK PHOS: 63 U/L (ref 38–126)
ALT: 41 U/L (ref 17–63)
ANION GAP: 7 (ref 5–15)
AST: 34 U/L (ref 15–41)
BUN: 17 mg/dL (ref 6–20)
CALCIUM: 8.6 mg/dL — AB (ref 8.9–10.3)
CHLORIDE: 107 mmol/L (ref 101–111)
CO2: 21 mmol/L — AB (ref 22–32)
Creatinine, Ser: 0.96 mg/dL (ref 0.61–1.24)
GFR calc non Af Amer: >60 mL/min (ref >60)
GLUCOSE: 93 mg/dL (ref 65–99)
Potassium: 4.2 mmol/L (ref 3.5–5.1)
SODIUM: 135 mmol/L (ref 135–145)
Total Bilirubin: 0.3 mg/dL (ref 0.3–1.2)
Total Protein: 8.1 g/dL (ref 6.5–8.1)

## 2017-01-30 LAB — URINALYSIS, ROUTINE W REFLEX MICROSCOPIC
Bilirubin Urine: NEGATIVE
Glucose, UA: NEGATIVE mg/dL
HGB URINE DIPSTICK: NEGATIVE
Ketones, ur: NEGATIVE mg/dL
Leukocytes, UA: NEGATIVE
Nitrite: NEGATIVE
PH: 5 (ref 5.0–8.0)
Protein, ur: NEGATIVE mg/dL
Specific Gravity, Urine: 1.017 (ref 1.005–1.030)

## 2017-01-30 LAB — LIPASE, BLOOD: LIPASE: 141 U/L — AB (ref 11–51)

## 2017-01-30 MED ORDER — SODIUM CHLORIDE 0.9 % IV BOLUS (SEPSIS)
1000.0000 mL | Freq: Once | INTRAVENOUS | Status: AC
  Administered 2017-01-30: 1000 mL via INTRAVENOUS

## 2017-01-30 NOTE — ED Triage Notes (Signed)
Pt states he is lactose intolerant and accidentally ate something with milk in it yesterday.  Now c/o abd pain and diarrhea.

## 2017-01-30 NOTE — ED Notes (Signed)
Ginger Ale given per order

## 2017-01-30 NOTE — Discharge Instructions (Signed)
You were seen in the emergency department today for abdominal pain and diarrhea. Your exam was reassuring. Your lipase was noted to be elevated (141) today. Please call to schedule a follow up appointment with your primary care doctor within 5-7 days to have this level rechecked. Return sooner if you experience return of abdominal pain, nausea, vomiting, or diarrhea. Please refrain from eating/drinking dairy products. Refrain from alcohol use or eating fatty foods. Continue to stay hydrated. Return to the ER if you experience fevers, chills, unexplained weight loss, dizziness, vision or gait changes, chest pain, shortness of breath, return of/change in abdominal pain, unable to tolerate food or fluids, blood in urine or stool, worsening symptoms, or any additional concerns.

## 2017-01-30 NOTE — ED Provider Notes (Signed)
[facility] Emergency Department Provider Note  ED Clinical Impression   Abdominal pain, unspecified abdominal location  Diarrhea, unspecified type  History   Chief Complaint Abdominal Pain   HPI  Patient is a 36 y.o. male with a PMH of htn and lactose intolerance who presents to ED for lower abdominal cramping and 2 episodes of nonbloody diarrhea, onset this morning, lasted a few hours and since resolved but his work suggested he be evaluated. Patient states he ate chicken yesterday evening with milk in the sauce, reports that these symptoms feel similar to previous lactose intolerance allergy after consuming dairy products. Denies previous abdominal surgeries. Denies fever, chills, unexplained weight loss, dizziness, vision or gait changes, CP, SOB, hemoptysis, hematemesis, hematochezia, melena, dysuria, hematuria, extremity weakness, rash, or any additional concerns. No anticoagulant use. No recent travel, known sick contacts, or recent AB or NSAID use.  Past Medical History:  Diagnosis Date  . Hypertension   . Obesity   . Obesity     History reviewed. No pertinent surgical history.  Current Outpatient Rx  . Order #: [id number] Class: Historical Med  . Order #: [id number] Class: Print  . Order #: [id number] Class: Print  . Order #: [id number] Class: Print  . Order #: [id number] Class: Print  . Order #: [id number] Class: Print    Allergies Patient has no known allergies.  No family history on file.  Social History Social History  Substance Use Topics  . Smoking status: Current Some Day Smoker    Types: Cigarettes  . Smokeless tobacco: Former NeurosurgeonUser  . Alcohol use Yes     Comment: occ    Review of Systems  Constitutional: Negative for fever, chills, or unexplained weight loss. Eyes: Negative for visual changes. Cardiovascular: Negative for chest pain or extremity swelling. Respiratory: Negative for shortness of breath or cough. Gastrointestinal: +abdominal pain, diarrhea,  since resolved. Negative for nausea, vomiting, hematemesis, hematochezia, or melena. Genitourinary: Negative for dysuria, urinary frequency, hematuria, or testicular pain. Musculoskeletal: Negative for back pain or extremity pain/swelling. Skin: Negative for rash. Neurological: Negative for headaches, dizziness, focal weakness, or numbness/tingling.  Physical Exam   VITAL SIGNS:   ED Triage Vitals  Enc Vitals Group     BP 01/30/17 0828 120/85     Pulse Rate 01/30/17 0828 79     Resp 01/30/17 0828 16     Temp 01/30/17 0828 98 F (36.7 C)     Temp Source 01/30/17 0828 Oral     SpO2 01/30/17 0828 98 %     Weight 01/30/17 0848 (!) 324 lb (147 kg)     Height 01/30/17 0848 5\' 9"  (1.753 m)     Head Circumference --      Peak Flow --      Pain Score 01/30/17 0848 6     Pain Loc --      Pain Edu? --      Excl. in GC? --     Constitutional: Alert and oriented. Well appearing and in no respiratory apparent distress. Eyes: PERRL, EOMI, Conjunctivae normal ENT      Head: Normocephalic and atraumatic.      Ears: TM intact bilaterally without erythema or effusion, no hemotympanum, external ear canals normal.       Mouth/Throat: Mucous membranes are moist. Oropharynx without erythema or exudate. No trismus. Normal voice, handling secretions normally.      Neck: Supple, no nuchal signs, full active ROM of neck.  Cardiovascular: Normal S1 S2, regular rhythm, normal rate. Normal and symmetric distal pulses  are present in all extremities. Respiratory: Breath sounds clear and equal bilaterally. No wheezes, rales, or rhonchi. Normal respiratory effort.  Gastrointestinal: Abdomen soft and nontender. No rebound or guarding. There is no CVA tenderness. Back: No midline tenderness. Musculoskeletal: Nontender with normal range of motion in all extremities. Neurologic: Speech clear. Alert and appropriate, no gross focal neurologic deficits are appreciated. Gait steady with ambulation. Equal strength in  all four extremities. Extremities neurovascularly intact.  Skin: Skin is warm, dry, and intact. No rash noted. Psychiatric: Mood and affect are normal. Speech and behavior are normal.  Labs   Labs Reviewed  LIPASE, BLOOD  COMPREHENSIVE METABOLIC PANEL  CBC  URINALYSIS, ROUTINE W REFLEX MICROSCOPIC    Radiology   No orders to display     ED Course, Assessment and Plan   Pt is a 36 y/o M, afebrile, who presents to ED for lower abdominal cramping and diarrhea, concern for lactose intolerance allergy vs. gastroenteritis. Denies abdominal pain or diarrhea at this time, states that he came to the ED to be cleared to return to work. Abdomen soft, nontender. Doubt appendicitis, cholecystitis, pancreatitis, perforated viscous/ulcer, ischemic or infectious colitis, diverticulitis, or small bowel obstruction at this time. Will get labs for electrolyte abnormality, hydrate, and re-eval.   11:11 AM Pt updated on results, CBC and CMP unremarkable,lipase 141 (previously 98 on 06/27/16). On re-eval, abdomen soft nontender. No epigastric abdominal pain. Denies nausea, diarrhea or abdominal pain at this time. Plan for PO challenge. If tolerates, likely dc with close PCP f/u;plan to repeat lipase by PCP within 5-7 days; pt amenable to this plan.   11:47 AM Pt tolerated PO. Denies abdominal pain at this time. Discussed results, discharge instructions, rx and safety, return precautions, and follow up. Pt verbalizes understanding using verbal teachback and agrees with plan, denies any additional concerns.   Previous chart, nursing notes, and vital signs reviewed.    Pertinent labs & imaging results that were available during my care of the patient were reviewed by me and considered in my medical decision making (see chart for details).     Wojeck, Hinton Dyer, NP 02/03/17 1418    Shaune Pollack, MD 02/03/17 1904

## 2017-04-28 ENCOUNTER — Encounter (HOSPITAL_COMMUNITY): Payer: Self-pay | Admitting: Emergency Medicine

## 2017-04-28 ENCOUNTER — Emergency Department (HOSPITAL_COMMUNITY)
Admission: EM | Admit: 2017-04-28 | Discharge: 2017-04-28 | Disposition: A | Discharge status: Home / Self Care | Payer: Self-pay | Attending: Emergency Medicine | Admitting: Emergency Medicine

## 2017-04-28 DIAGNOSIS — R0989 Other specified symptoms and signs involving the circulatory and respiratory systems: Secondary | ICD-10-CM | POA: Insufficient documentation

## 2017-04-28 DIAGNOSIS — I10 Essential (primary) hypertension: Secondary | ICD-10-CM | POA: Insufficient documentation

## 2017-04-28 DIAGNOSIS — J069 Acute upper respiratory infection, unspecified: Secondary | ICD-10-CM | POA: Insufficient documentation

## 2017-04-28 DIAGNOSIS — F17210 Nicotine dependence, cigarettes, uncomplicated: Secondary | ICD-10-CM | POA: Insufficient documentation

## 2017-04-28 LAB — RAPID STREP SCREEN (MED CTR MEBANE ONLY): Streptococcus, Group A Screen (Direct): NEGATIVE

## 2017-04-28 MED ORDER — ACETAMINOPHEN 500 MG PO TABS
500.0000 mg | ORAL_TABLET | Freq: Once | ORAL | Status: AC
  Administered 2017-04-28: 500 mg via ORAL
  Filled 2017-04-28: qty 1

## 2017-04-28 NOTE — Discharge Instructions (Signed)
Please read instructions below. You can take tylenol as needed for sore throat or body aches. Drink plenty of water. You can take mucinex as needed for congestion. Follow up with your primary care provider as needed. Return to the ER for difficulty swallowing liquids, difficulty breathing, or new or worsening symptoms.

## 2017-04-28 NOTE — ED Provider Notes (Signed)
[facility]-EMERGENCY DEPT Provider Note   CSN: [id number] Arrival date & time: 04/28/17  1327     History   Chief Complaint Chief Complaint  Patient presents with  . URI    HPI Adam Bush is a 36 y.o. male history of hypertension, presenting to the ED with acute onset of mildly sore throat and runny nose that began yesterday. Patient states his son has a cold and thinks he may have contracted the same illness. Presented here for evaluation because his job instructed him to in order to return to work. He states yesterday his throat felt a little itchy with a runny nose, and today his throat feels somewhat better however has developed nasal congestion and a mild headache. Denies fever or chills, chest pain or shortness of breath, cough, ear pain, difficulty breathing or swallowing, or other complaints today.  The history is provided by the patient.    Past Medical History:  Diagnosis Date  . Hypertension   . Obesity   . Obesity     There are no active problems to display for this patient.   History reviewed. No pertinent surgical history.     Home Medications    Prior to Admission medications   Not on File    Family History History reviewed. No pertinent family history.  Social History Social History  Substance Use Topics  . Smoking status: Current Some Day Smoker    Types: Cigarettes  . Smokeless tobacco: Former Neurosurgeon  . Alcohol use Yes     Comment: occ     Allergies   Patient has no known allergies.   Review of Systems Review of Systems  Constitutional: Negative for chills and fever.  HENT: Positive for congestion, rhinorrhea and sore throat. Negative for ear pain, trouble swallowing and voice change.   Eyes: Negative for visual disturbance.  Respiratory: Negative for cough and shortness of breath.   Cardiovascular: Negative for chest pain.  Gastrointestinal: Negative for abdominal pain.  Neurological: Positive for headaches.     Physical  Exam Updated Vital Signs BP 120/76 (BP Location: Right Arm)   Pulse 86   Temp 99 F (37.2 C) (Oral)   Resp 18   SpO2 100%   Physical Exam  Constitutional: He appears well-developed and well-nourished. No distress.  Tolerating secretions  HENT:  Head: Normocephalic and atraumatic.  Right Ear: Hearing, tympanic membrane, external ear and ear canal normal.  Left Ear: Hearing, tympanic membrane, external ear and ear canal normal.  Nose: Nose normal.  Mouth/Throat: Uvula is midline. No trismus in the jaw. No uvula swelling. Posterior oropharyngeal erythema (Very mild erythema. No edema) present. No tonsillar exudate.  Eyes: Pupils are equal, round, and reactive to light. Conjunctivae and EOM are normal.  Neck: Normal range of motion. Neck supple. No tracheal deviation present.  Cardiovascular: Normal rate, regular rhythm, normal heart sounds and intact distal pulses.   Pulmonary/Chest: Effort normal and breath sounds normal. No stridor. No respiratory distress. He has no wheezes. He has no rales.  Abdominal: Soft. Bowel sounds are normal. There is no tenderness.  Lymphadenopathy:    He has no cervical adenopathy.  Psychiatric: He has a normal mood and affect. His behavior is normal.  Nursing note and vitals reviewed.    ED Treatments / Results  Labs (all labs ordered are listed, but only abnormal results are displayed) Labs Reviewed  RAPID STREP SCREEN (NOT AT [facility])  CULTURE, GROUP A STREP [facility])    EKG  EKG Interpretation None  Radiology No results found.  Procedures Procedures (including critical care time)  Medications Ordered in ED Medications  acetaminophen (TYLENOL) tablet 500 mg (not administered)     Initial Impression / Assessment and Plan / ED Course  I have reviewed the triage vital signs and the nursing notes.  Pertinent labs & imaging results that were available during my care of the patient were reviewed by me and considered in my medical  decision making (see chart for details).    Patients symptoms are consistent with URI, likely viral etiology. Discussed that antibiotics are not indicated for viral infections. Pt will be discharged with symptomatic treatment.  Verbalizes understanding and is agreeable with plan. Pt is hemodynamically stable & in NAD prior to dc.  Discussed results, findings, treatment and follow up. Patient advised of return precautions. Patient verbalized understanding and agreed with plan.  Final Clinical Impressions(s) / ED Diagnoses   Final diagnoses:  Viral URI    New Prescriptions New Prescriptions   No medications on file     Russo, [place] N, PA-C 04/28/17 1558    Donnetta Hutching, MD 04/29/17 [phone]

## 2017-04-28 NOTE — ED Triage Notes (Signed)
Pt arrives via POV from home with runny nose, nasal congestion, sore throat. Denies recent fever. Pt states his son has a cold. Pt awake, alert, appropriate, VSS.

## 2017-04-28 NOTE — ED Notes (Signed)
PT states understanding of care given, follow up care. PT ambulated from ED to car with a steady gait.  

## 2017-04-28 NOTE — ED Notes (Signed)
Pt called for treatment room x1 no answer

## 2017-04-30 LAB — CULTURE, GROUP A STREP (THRC)

## 2017-05-01 ENCOUNTER — Emergency Department (HOSPITAL_COMMUNITY)
Admission: EM | Admit: 2017-05-01 | Discharge: 2017-05-01 | Disposition: A | Discharge status: Home / Self Care | Payer: Self-pay | Attending: Emergency Medicine | Admitting: Emergency Medicine

## 2017-05-01 ENCOUNTER — Encounter (HOSPITAL_COMMUNITY): Payer: Self-pay | Admitting: Emergency Medicine

## 2017-05-01 DIAGNOSIS — I10 Essential (primary) hypertension: Secondary | ICD-10-CM | POA: Insufficient documentation

## 2017-05-01 DIAGNOSIS — B9789 Other viral agents as the cause of diseases classified elsewhere: Secondary | ICD-10-CM

## 2017-05-01 DIAGNOSIS — F17210 Nicotine dependence, cigarettes, uncomplicated: Secondary | ICD-10-CM | POA: Insufficient documentation

## 2017-05-01 DIAGNOSIS — J069 Acute upper respiratory infection, unspecified: Secondary | ICD-10-CM | POA: Insufficient documentation

## 2017-05-01 NOTE — ED Triage Notes (Signed)
Pt states for 1 week he has had nasal congestion with cough. Pt is taking OTC cold medication but still feels congested and was sent home from his job today.

## 2017-05-01 NOTE — ED Provider Notes (Signed)
[facility] EMERGENCY DEPARTMENT Provider Note   CSN: [id number] Arrival date & time: 05/01/17  1419     History   Chief Complaint Chief Complaint  Patient presents with  . Nasal Congestion    HPI  Adam Bush is a 36 y.o. Male With a history of hypertension, presents to the ED with nasal congestion, sore throat, sinus pressure and cough since Friday. Patient states his son had a cold last week and he thinks that he may have caught it from him. Patient was seen here for similar symptoms on 10/12 and has been treating his symptoms with Mucinex and Motrin. Patient reports he tried to go to work today but was sent home early, and told he had to have a doctor's note before he could come back. 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 He feels the symptoms have been improving somewhat with rest over the weekend, patient has been trying to hydrate with lots of fluids.        Past Medical History:  Diagnosis Date  . Hypertension   . Obesity   . Obesity     There are no active problems to display for this patient.   History reviewed. No pertinent surgical history.     Home Medications    Prior to Admission medications   Not on File    Family History No family history on file.  Social History Social History  Substance Use Topics  . Smoking status: Current Some Day Smoker    Types: Cigarettes  . Smokeless tobacco: Former Neurosurgeon  . Alcohol use Yes     Comment: occ     Allergies   Patient has no known allergies.   Review of Systems Review of Systems  Constitutional: Negative for chills and fever.  HENT: Positive for congestion, postnasal drip, rhinorrhea, sinus pressure and sore throat. Negative for ear pain, trouble swallowing and voice change.   Eyes: Negative for pain and discharge.  Respiratory: Positive for cough. Negative for chest tightness and shortness of breath.   Cardiovascular: Negative for  chest pain.  Gastrointestinal: Negative for abdominal pain, nausea and vomiting.  Skin: Negative for rash.     Physical Exam Updated Vital Signs BP 131/81 (BP Location: Right Arm)   Pulse 94   Temp 98 F (36.7 C) (Oral)   Resp 16   SpO2 98%   Physical Exam  Constitutional: He appears well-developed and well-nourished. No distress.  HENT:  Head: Normocephalic and atraumatic.  TMs clear with good landmarks, moderate nasal mucosa edema with clear rhinorrhea, posterior oropharynx clear with minimal erythema, no edema or exudates.  Eyes: Right eye exhibits no discharge. Left eye exhibits no discharge.  Neck: Neck supple.  Cardiovascular: Normal rate, regular rhythm and normal heart sounds.   Pulmonary/Chest: Effort normal and breath sounds normal. No respiratory distress. He has no wheezes. He has no rales.  Lymphadenopathy:    He has no cervical adenopathy.  Neurological: He is alert. Coordination normal.  Skin: Skin is warm and dry. Capillary refill takes less than 2 seconds. He is not diaphoretic.  Psychiatric: He has a normal mood and affect. His behavior is normal.  Nursing note and vitals reviewed.    ED Treatments / Results  Labs (all labs ordered are listed, but only abnormal results are displayed) Labs Reviewed - No data to display  EKG  EKG Interpretation None       Radiology No results  found.  Procedures Procedures (including critical care time)  Medications Ordered in ED Medications - No data to display   Initial Impression / Assessment and Plan / ED Course  I have reviewed the triage vital signs and the nursing notes.  Pertinent labs & imaging results that were available during my care of the patient were reviewed by me and considered in my medical decision making (see chart for details).  Patients symptoms are consistent with URI, likely viral etiology. Seen for similar on Friday and symptoms starting to improve. Discussed that antibiotics are not  indicated for viral infections. Pt will be discharged with symptomatic treatment.  Verbalizes understanding and is agreeable with plan. Pt is hemodynamically stable & in NAD prior to dc.   Final Clinical Impressions(s) / ED Diagnoses   Final diagnoses:  Viral URI with cough    New Prescriptions New Prescriptions   No medications on file     Legrand Rams 05/01/17 1709    Raeford Razor, MD 05/02/17 1150

## 2017-05-01 NOTE — Discharge Instructions (Signed)
In addition to Mucinex and Motrin, you can treat the symptoms with Sudafed and Flonase, also recommend keeping your throat bathed with water cough drops to help prevent coughing while you're at work. If after a week symptoms are not improving, please follow-up with the [facility], if you developed fevers or chills, shortness of breath chest pain, or other new or concerning symptoms please return to the ED for evaluation

## 2017-06-12 IMAGING — DX DG THORACIC SPINE 3V
3 series · 3 of 3 positions shown · non-contrast
Comparison: None.

CLINICAL DATA: Thoracic back pain for 2 weeks.  No known injury.

EXAM:
THORACIC SPINE - 3 VIEWS

[t-spine ap]
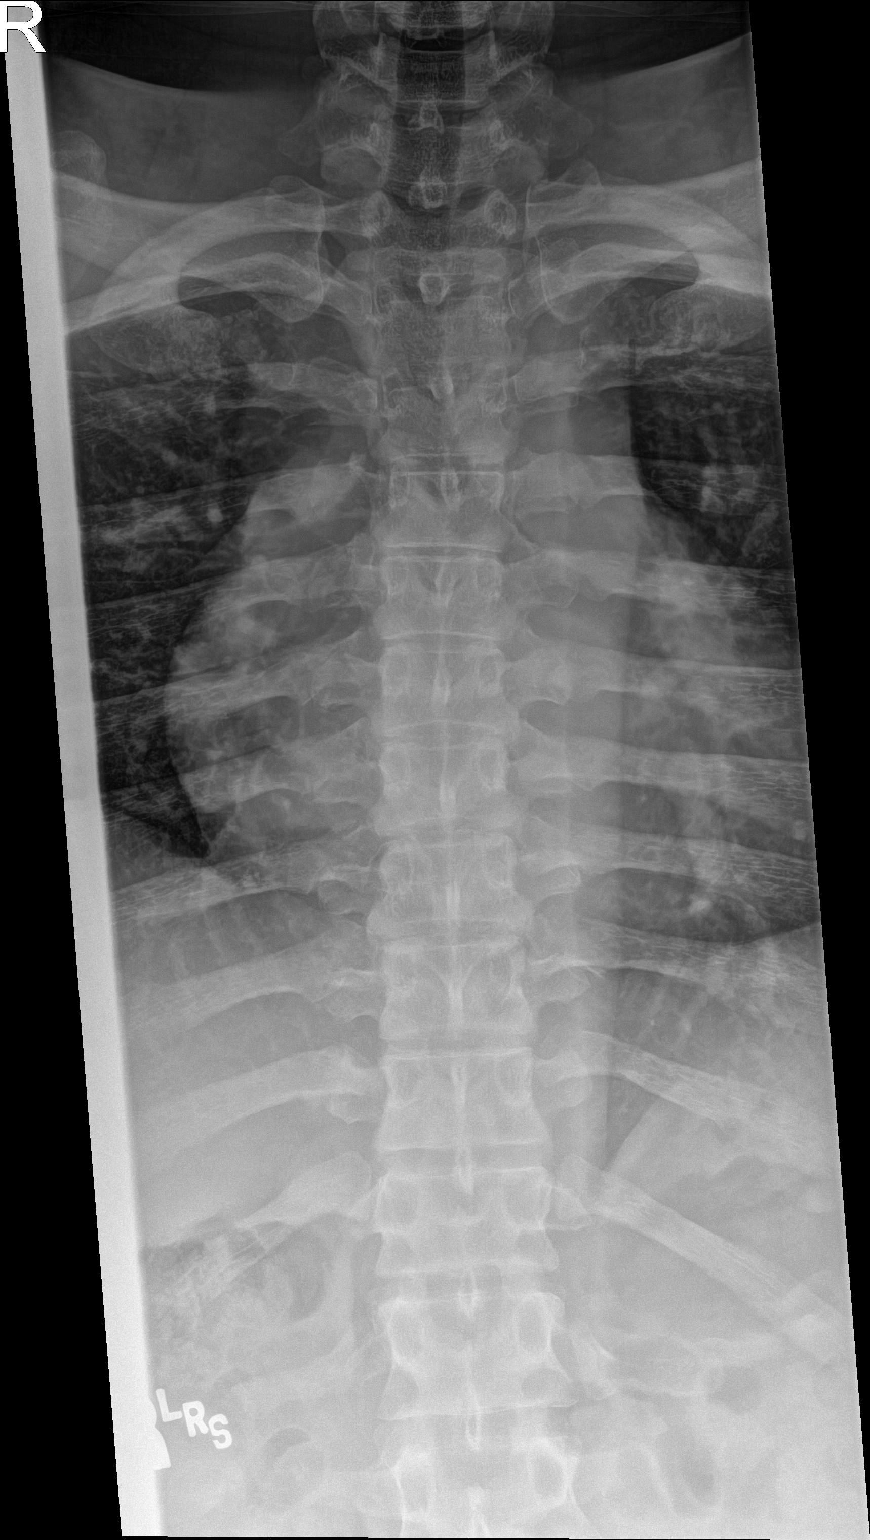

[t-spine lat]
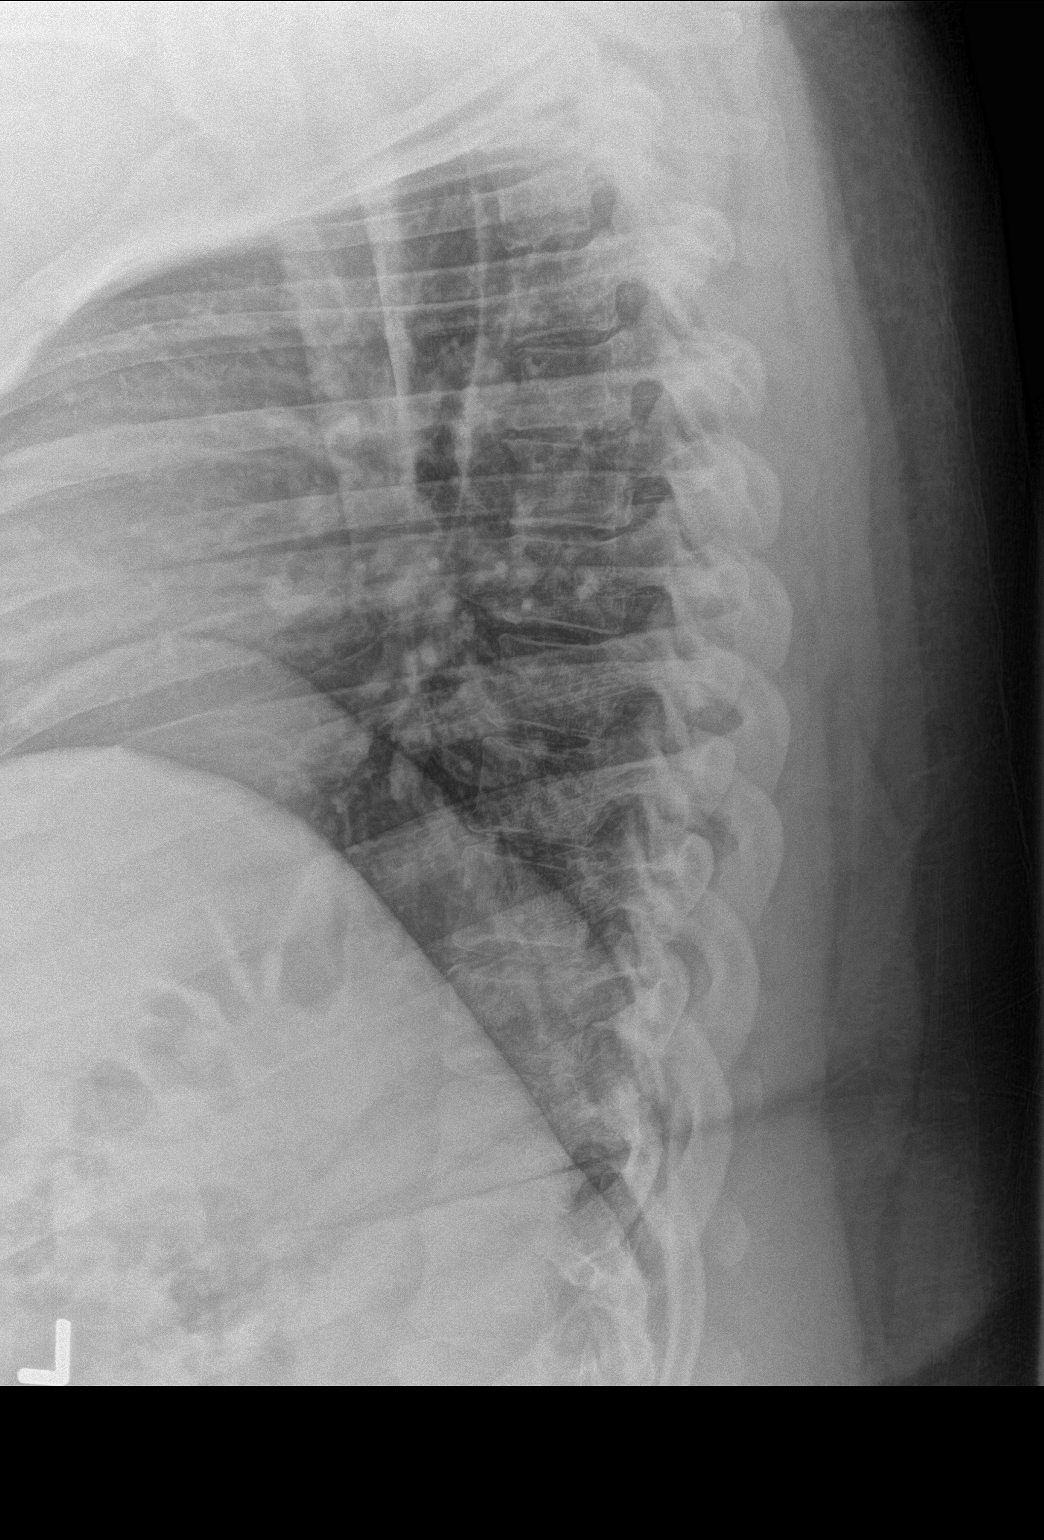

[t-spine swimmers]
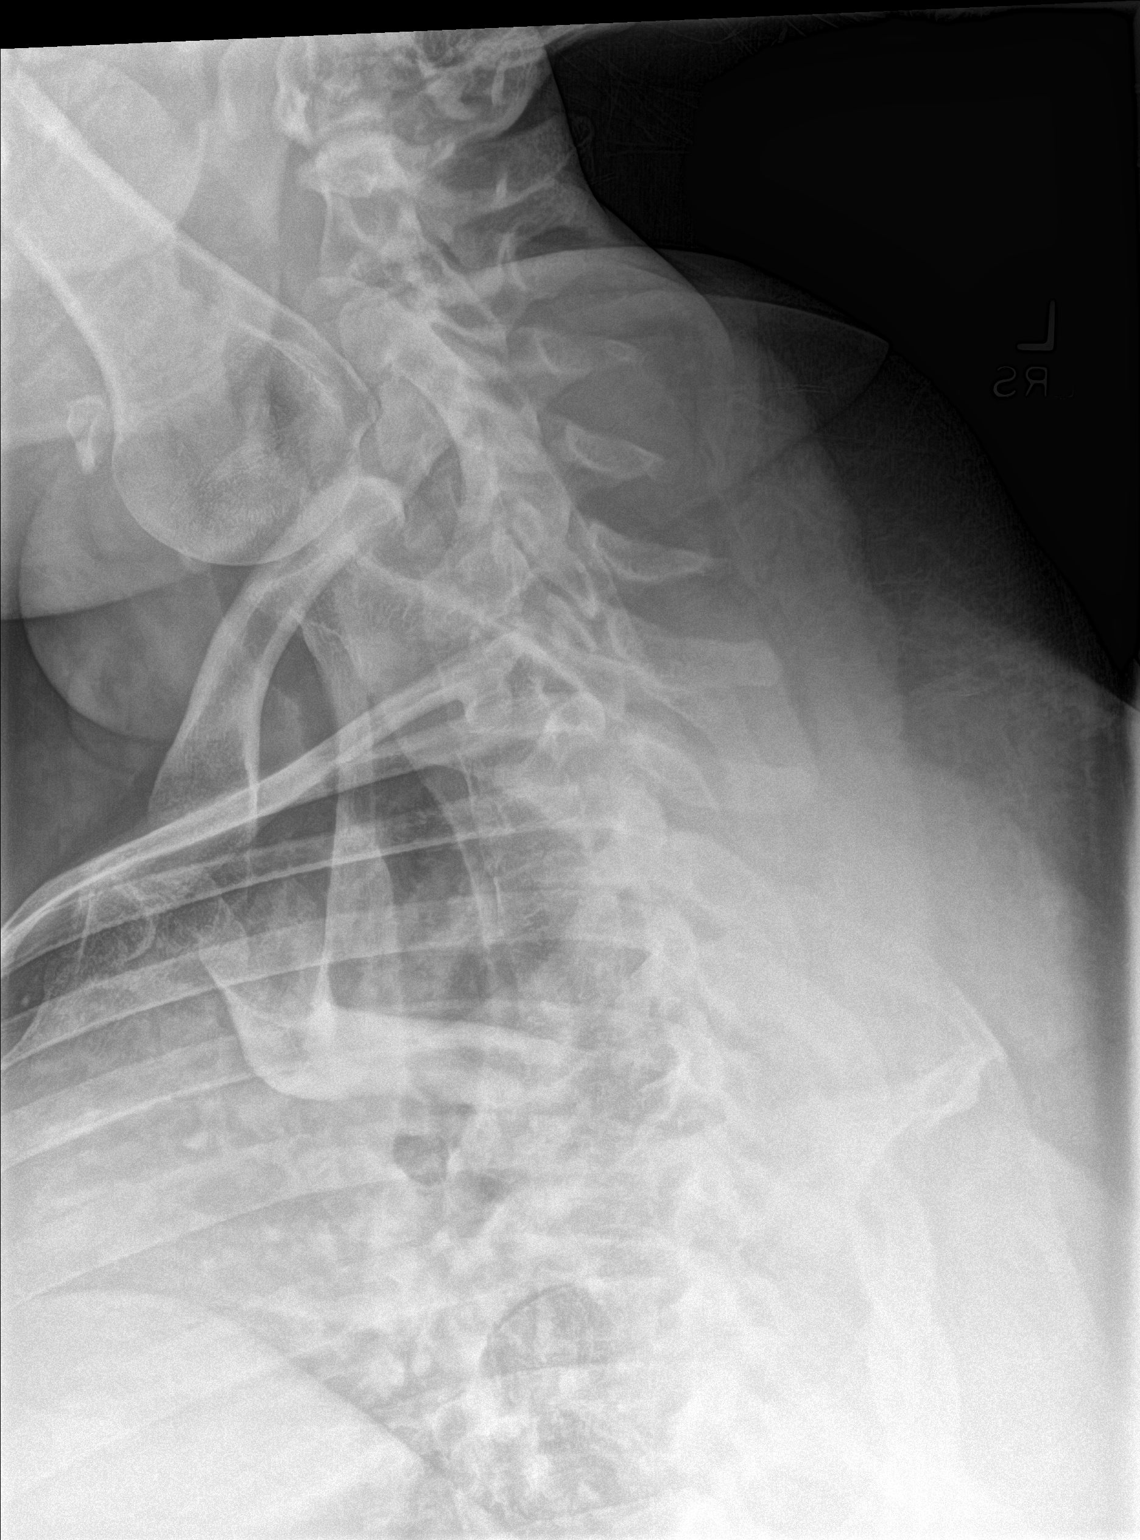

[3 of 3 positions shown; findings below may reference images not displayed]

FINDINGS: There is no evidence of thoracic spine fracture. Alignment is
normal. No other significant bone abnormalities are identified.
IMPRESSION: Negative thoracic spine radiographs.

## 2017-06-19 ENCOUNTER — Other Ambulatory Visit: Payer: Self-pay

## 2017-06-19 ENCOUNTER — Encounter (HOSPITAL_COMMUNITY): Payer: Self-pay | Admitting: Emergency Medicine

## 2017-06-19 DIAGNOSIS — R112 Nausea with vomiting, unspecified: Secondary | ICD-10-CM | POA: Insufficient documentation

## 2017-06-19 DIAGNOSIS — F17210 Nicotine dependence, cigarettes, uncomplicated: Secondary | ICD-10-CM | POA: Insufficient documentation

## 2017-06-19 DIAGNOSIS — R197 Diarrhea, unspecified: Secondary | ICD-10-CM | POA: Insufficient documentation

## 2017-06-19 DIAGNOSIS — I10 Essential (primary) hypertension: Secondary | ICD-10-CM | POA: Insufficient documentation

## 2017-06-19 LAB — I-STAT CHEM 8, ED
BUN: 19 mg/dL (ref 6–20)
Calcium, Ion: 1.23 mmol/L (ref 1.15–1.40)
Chloride: 102 mmol/L (ref 101–111)
Creatinine, Ser: 0.9 mg/dL (ref 0.61–1.24)
Glucose, Bld: 76 mg/dL (ref 65–99)
HEMATOCRIT: 48 % (ref 39.0–52.0)
HEMOGLOBIN: 16.3 g/dL (ref 13.0–17.0)
POTASSIUM: 4.4 mmol/L (ref 3.5–5.1)
Sodium: 143 mmol/L (ref 135–145)
TCO2: 28 mmol/L (ref 22–32)

## 2017-06-19 LAB — CBC
HEMATOCRIT: 43.5 % (ref 39.0–52.0)
Hemoglobin: 14 g/dL (ref 13.0–17.0)
MCH: 28.9 pg (ref 26.0–34.0)
MCHC: 32.2 g/dL (ref 30.0–36.0)
MCV: 89.7 fL (ref 78.0–100.0)
Platelets: 276 10*3/uL (ref 150–400)
RBC: 4.85 MIL/uL (ref 4.22–5.81)
RDW: 15.1 % (ref 11.5–15.5)
WBC: 7.7 10*3/uL (ref 4.0–10.5)

## 2017-06-19 LAB — LIPASE, BLOOD: LIPASE: 34 U/L (ref 11–51)

## 2017-06-19 NOTE — ED Triage Notes (Signed)
Pt to ER for evaluation of epigastric discomfort onset today with nausea, vomiting, and diarrhea. States was sent home from work due to vomiting. States here for work note.

## 2017-06-20 ENCOUNTER — Emergency Department (HOSPITAL_COMMUNITY)
Admission: EM | Admit: 2017-06-20 | Discharge: 2017-06-20 | Disposition: A | Discharge status: Home / Self Care | Payer: Self-pay | Attending: Emergency Medicine | Admitting: Emergency Medicine

## 2017-06-20 DIAGNOSIS — R112 Nausea with vomiting, unspecified: Secondary | ICD-10-CM

## 2017-06-20 DIAGNOSIS — R197 Diarrhea, unspecified: Secondary | ICD-10-CM

## 2017-06-20 NOTE — ED Provider Notes (Signed)
[facility] EMERGENCY DEPARTMENT Provider Note   CSN: [id number] Arrival date & time: 06/19/17  1637     History   Chief Complaint Chief Complaint  Patient presents with  . Abdominal Pain    HPI Adam Bush is a 36 y.o. male.  HPI  This is a 36 year old male with history of hypertension who presents with epigastric pain, vomiting, diarrhea.  Patient reports that he developed a stomachache yesterday.  He went to work and had one episode of nonbloody, nonbilious emesis.  He has had multiple episodes of diarrhea.  Since waiting in the waiting room he states that his symptoms have completely resolved.  However, his employer requires a work note.  Past Medical History:  Diagnosis Date  . Hypertension   . Obesity   . Obesity     There are no active problems to display for this patient.   History reviewed. No pertinent surgical history.     Home Medications    Prior to Admission medications   Not on File    Family History History reviewed. No pertinent family history.  Social History Social History   Tobacco Use  . Smoking status: Current Some Day Smoker    Types: Cigarettes  . Smokeless tobacco: Former Engineer, waterUser  Substance Use Topics  . Alcohol use: Yes    Comment: occ  . Drug use: Yes    Types: Marijuana    Comment: occ     Allergies   Patient has no known allergies.   Review of Systems Review of Systems  Constitutional: Negative for fever.  Respiratory: Negative for shortness of breath.   Cardiovascular: Negative for chest pain.  Gastrointestinal: Positive for abdominal pain, diarrhea, nausea and vomiting.  All other systems reviewed and are negative.    Physical Exam Updated Vital Signs BP (!) 141/113   Pulse 79   Temp 97.9 F (36.6 C) (Oral)   Resp 16   Ht 5\' 9"  (1.753 m)   Wt (!) 142.9 kg (315 lb)   SpO2 98%   BMI 46.52 kg/m   Physical Exam  Constitutional: He is oriented to person, place, and time. He  appears well-developed and well-nourished.  Obese  HENT:  Head: Normocephalic and atraumatic.  Neck: Neck supple.  Cardiovascular: Normal rate, regular rhythm and normal heart sounds.  No murmur heard. Pulmonary/Chest: Effort normal and breath sounds normal. No respiratory distress. He has no wheezes.  Abdominal: Soft. Normal appearance and bowel sounds are normal. There is no tenderness. There is no rebound.  Musculoskeletal: He exhibits no edema.  Lymphadenopathy:    He has no cervical adenopathy.  Neurological: He is alert and oriented to person, place, and time.  Skin: Skin is warm and dry.  Psychiatric: He has a normal mood and affect.  Nursing note and vitals reviewed.    ED Treatments / Results  Labs (all labs ordered are listed, but only abnormal results are displayed) Labs Reviewed  LIPASE, BLOOD  CBC  I-STAT CHEM 8, ED    EKG  EKG Interpretation None       Radiology No results found.  Procedures Procedures (including critical care time)  Medications Ordered in ED Medications - No data to display   Initial Impression / Assessment and Plan / ED Course  I have reviewed the triage vital signs and the nursing notes.  Pertinent labs & imaging results that were available during my care of the patient were reviewed by me and considered in my medical decision  making (see chart for details).     Patient presents with abdominal pain, n/v, diarrhea.  Symptoms resolved and patient feels much better.  He is requesting a note for work.  Lab work is reassuring.  As well as exam.  Suspect viral etiology.  Will discharge with note.  After history, exam, and medical workup I feel the patient has been appropriately medically screened and is safe for discharge home. Pertinent diagnoses were discussed with the patient. Patient was given return precautions.   Final Clinical Impressions(s) / ED Diagnoses   Final diagnoses:  Nausea vomiting and diarrhea    ED Discharge  Orders    None       Shon BatonHorton, Courtney F, MD 06/20/17 [phone]

## 2017-07-19 ENCOUNTER — Encounter (HOSPITAL_COMMUNITY): Payer: Self-pay

## 2017-07-19 ENCOUNTER — Other Ambulatory Visit: Payer: Self-pay

## 2017-07-19 ENCOUNTER — Emergency Department (HOSPITAL_COMMUNITY): Payer: Self-pay

## 2017-07-19 ENCOUNTER — Emergency Department (HOSPITAL_COMMUNITY)
Admission: EM | Admit: 2017-07-19 | Discharge: 2017-07-19 | Disposition: A | Discharge status: Home / Self Care | Payer: Self-pay | Attending: Emergency Medicine | Admitting: Emergency Medicine

## 2017-07-19 DIAGNOSIS — Y998 Other external cause status: Secondary | ICD-10-CM | POA: Insufficient documentation

## 2017-07-19 DIAGNOSIS — W108XXA Fall (on) (from) other stairs and steps, initial encounter: Secondary | ICD-10-CM | POA: Insufficient documentation

## 2017-07-19 DIAGNOSIS — F1210 Cannabis abuse, uncomplicated: Secondary | ICD-10-CM | POA: Insufficient documentation

## 2017-07-19 DIAGNOSIS — Y92038 Other place in apartment as the place of occurrence of the external cause: Secondary | ICD-10-CM | POA: Insufficient documentation

## 2017-07-19 DIAGNOSIS — M25561 Pain in right knee: Secondary | ICD-10-CM | POA: Insufficient documentation

## 2017-07-19 DIAGNOSIS — I10 Essential (primary) hypertension: Secondary | ICD-10-CM | POA: Insufficient documentation

## 2017-07-19 DIAGNOSIS — F17210 Nicotine dependence, cigarettes, uncomplicated: Secondary | ICD-10-CM | POA: Insufficient documentation

## 2017-07-19 DIAGNOSIS — Y9301 Activity, walking, marching and hiking: Secondary | ICD-10-CM | POA: Insufficient documentation

## 2017-07-19 NOTE — ED Provider Notes (Signed)
[facility] EMERGENCY DEPARTMENT Provider Note   CSN: [id number] Arrival date & time: 07/19/17  4098     History   Chief Complaint No chief complaint on file.   HPI Quindarius Cabello is a 37 y.o. male.  The history is provided by the patient and medical records. No language interpreter was used.   Demarquis Osley is a 37 y.o. male  with a PMH of HTN who presents to the Emergency Department complaining of right knee pain secondary to fall just prior to arrival.  Patient states he was walking down his apartment steps which are beginning renovated.  Some of the steps were unsteady and he slipped.  He grabbed onto the railing and hit his shin and front of the knee on the wall.  Able to ambulate without any discomfort, but pain with bending the knee and palpation.  No medications taken prior to arrival for symptoms.  No history of injuries to the right lower extremity in the past.  No numbness, tingling, weakness.  No open wounds.   Past Medical History:  Diagnosis Date  . Hypertension   . Obesity   . Obesity     There are no active problems to display for this patient.   History reviewed. No pertinent surgical history.     Home Medications    Prior to Admission medications   Not on File    Family History No family history on file.  Social History Social History   Tobacco Use  . Smoking status: Current Some Day Smoker    Types: Cigarettes  . Smokeless tobacco: Former Engineer, water Use Topics  . Alcohol use: Yes    Comment: occ  . Drug use: Yes    Types: Marijuana    Comment: occ     Allergies   Patient has no known allergies.   Review of Systems Review of Systems  Musculoskeletal: Positive for arthralgias and myalgias.  Skin: Negative for wound.  Neurological: Negative for weakness and numbness.     Physical Exam Updated Vital Signs BP (!) 163/106   Pulse 78   Temp 97.9 F (36.6 C) (Oral)   Resp 18   SpO2 99%    Physical Exam  Constitutional: He is oriented to person, place, and time. He appears well-developed and well-nourished. No distress.  HENT:  Head: Normocephalic and atraumatic.  Cardiovascular: Normal rate, regular rhythm and normal heart sounds.  No murmur heard. Pulmonary/Chest: Effort normal and breath sounds normal. No respiratory distress.  Musculoskeletal:       Legs: No tenderness to the knee. Full ROM although some discomfort to the lateral aspect with flexion. No joint line tenderness.No joint effusion or swelling of the knee appreciated. No abnormal alignment or patellar mobility. No bruising, erythema or warmth overlaying the joint. No varus/valgus laxity. Negative drawer's, Lachman's and McMurray's.  No crepitus. 2+ DP pulses bilaterally. All compartments are soft. Sensation intact distal to injury.  Neurological: He is alert and oriented to person, place, and time.  Skin: Skin is warm and dry.  Nursing note and vitals reviewed.    ED Treatments / Results  Labs (all labs ordered are listed, but only abnormal results are displayed) Labs Reviewed - No data to display  EKG  EKG Interpretation None       Radiology Dg Knee Complete 4 Views Right  Result Date: 07/19/2017 CLINICAL DATA:  Right knee pain after falling down steps. EXAM: RIGHT KNEE - COMPLETE 4+ VIEW COMPARISON:  None.  FINDINGS: No evidence of fracture, dislocation, or joint effusion. No evidence of arthropathy or other focal bone abnormality. Soft tissues are unremarkable. IMPRESSION: Negative. Electronically Signed   By: Francene BoyersJames  Maxwell M.D.   On: 07/19/2017 10:39    Procedures Procedures (including critical care time)  Medications Ordered in ED Medications - No data to display   Initial Impression / Assessment and Plan / ED Course  I have reviewed the triage vital signs and the nursing notes.  Pertinent labs & imaging results that were available during my care of the patient were reviewed by me and  considered in my medical decision making (see chart for details).    Osker MasonJeremiah Woodfin is a 37 y.o. male who presents to ED for right knee and shin pain after fall today. Knee exam quite reassuring with ligaments intact and full ROM. Minimal tenderness. NVI. X-ray negative. Does have hematoma to the shin c/w soft tissue injury. Supportive care instructions discussed. Ortho or PCP follow up if no improvement. Hx of HTN. Asymptomatic today. Follow up with pcp for recheck. All questions answered.   Final Clinical Impressions(s) / ED Diagnoses   Final diagnoses:  Acute pain of right knee    ED Discharge Orders    None       Taylon Louison, Chase PicketJaime Pilcher, PA-C 07/19/17 1223    Linwood DibblesKnapp, Jon, MD 07/19/17 [phone]

## 2017-07-19 NOTE — ED Triage Notes (Signed)
Patient tripped and missed step this am landing on right knee, pain with ambulation

## 2017-07-19 NOTE — Discharge Instructions (Signed)
It was my pleasure taking care of you today!   Tylenol or ibuprofen as needed for pain. Ice throughout the day for pain / swelling relief.  Follow up with the orthopedist listed if symptoms do not improve in one week.   Return to the ER for new or worsening symptoms, any additional concerns.

## 2017-09-18 ENCOUNTER — Encounter (HOSPITAL_COMMUNITY): Payer: Self-pay | Admitting: Emergency Medicine

## 2017-09-18 ENCOUNTER — Other Ambulatory Visit: Payer: Self-pay

## 2017-09-18 ENCOUNTER — Ambulatory Visit (HOSPITAL_COMMUNITY)
Admission: EM | Admit: 2017-09-18 | Discharge: 2017-09-18 | Disposition: A | Discharge status: Home / Self Care | Payer: BC Managed Care – PPO

## 2017-09-18 DIAGNOSIS — J111 Influenza due to unidentified influenza virus with other respiratory manifestations: Secondary | ICD-10-CM

## 2017-09-18 DIAGNOSIS — R69 Illness, unspecified: Secondary | ICD-10-CM

## 2017-09-18 NOTE — ED Provider Notes (Addendum)
09/18/2017 8:08 PM   DOB: [birth year] / MRN: [id number]  SUBJECTIVE:  Adam Bush is a 37 y.o. male presenting for cough, fever T-max 102, nasal congestion, back pain.  Symptoms started abruptly Friday night and he began to feel better starting Saturday night.  Sunday he says that he basically stayed in bed most of the day.  He has not had the seasonal flu shot.  He denies a history of diabetes and asthma.  He is feeling better today.  He would like a work note excusing him from work today.  He has No Known Allergies.   He  has a past medical history of Hypertension, Obesity, and Obesity.    He  reports that he has been smoking cigarettes.  He has quit using smokeless tobacco. He reports that he drinks alcohol. He reports that he uses drugs. Drug: Marijuana. He  reports that he currently engages in sexual activity. The patient  has no past surgical history on file.  His family history is not on file.  Review of Systems  Constitutional: Negative for diaphoresis.  Respiratory: Negative for cough, hemoptysis, sputum production, shortness of breath and wheezing.   Cardiovascular: Negative for chest pain, orthopnea and leg swelling.  Gastrointestinal: Negative for nausea.  Neurological: Negative for dizziness.    OBJECTIVE:  BP 131/88 (BP Location: Left Arm)   Pulse 76   Temp 98.6 F (37 C) (Oral)   SpO2 100%   Physical Exam  Constitutional: He is active and cooperative.  Cardiovascular: Normal rate, regular rhythm, S1 normal, S2 normal, normal heart sounds, intact distal pulses and normal pulses. Exam reveals no gallop and no friction rub.  No murmur heard. Pulmonary/Chest: Effort normal. No tachypnea. He has no rales.  Abdominal: He exhibits no distension.  Musculoskeletal: He exhibits no edema.  Neurological: He is alert.  Skin: Skin is warm.  Vitals reviewed.   No results found for this or any previous visit (from the past 72 hour(s)).  No results found.  ASSESSMENT AND  PLAN:  No orders of the defined types were placed in this encounter.    Influenza-like illness: Resolving. Advised he stay the course with at home therapies. Back to work tomorrow per patient's request.       The patient is advised to call or return to clinic if he does not see an improvement in symptoms, or to seek the care of the closest emergency department if he worsens with the above plan.   Deliah BostonMichael Bush, MHS, PA-C 09/18/2017 8:08 PM    Adam Bush, Adam L, PA-C 09/18/17 2009    Adam Bush, Adam L, PA-C 09/18/17 2011

## 2017-09-18 NOTE — Discharge Instructions (Signed)
Stay the course with your at home medications.

## 2017-09-18 NOTE — ED Triage Notes (Signed)
Pt reports nasal congestion, sore throat, body aches and nausea since Friday.  He reports one episode of vomiting.  He has been taking Mucinex.

## 2018-01-19 ENCOUNTER — Encounter (HOSPITAL_COMMUNITY): Payer: Self-pay | Admitting: *Deleted

## 2018-01-19 ENCOUNTER — Other Ambulatory Visit: Payer: Self-pay

## 2018-01-19 ENCOUNTER — Ambulatory Visit (HOSPITAL_COMMUNITY)
Admission: EM | Admit: 2018-01-19 | Discharge: 2018-01-19 | Disposition: A | Discharge status: Home / Self Care | Payer: BC Managed Care – PPO | Attending: Family Medicine | Admitting: Family Medicine

## 2018-01-19 DIAGNOSIS — R195 Other fecal abnormalities: Secondary | ICD-10-CM

## 2018-01-19 DIAGNOSIS — A084 Viral intestinal infection, unspecified: Secondary | ICD-10-CM

## 2018-01-19 MED ORDER — ONDANSETRON HCL 4 MG PO TABS: 4.0000 mg | ORAL_TABLET | Freq: Four times a day (QID) | ORAL | 0 refills | Status: DC | PRN

## 2018-01-19 NOTE — ED Provider Notes (Signed)
[facility] CARE CENTER   [id number] 01/19/18 Arrival Time: 1048  SUBJECTIVE:  Adam Bush is a 37 y.o. male who presents with complaint of 2 episodes of loose stools that began abruptly 1 day ago.  Went to a cookout and recalls drinking an alcoholic beverages with creamer.  Complains of associated abdominal pressure.  Has not tried OTC medications.  Denies alleviating or aggravating factors.  Reports similar symptoms in the past that improved with rest.  Last BM this AM with looser stools.  Complains of nausea, decreased appetite, and bloating.    Denies fever, chills, appetite changes, weight changes, vomiting, chest pain, SOB, constipation, hematochezia, melena, dysuria, difficulty urinating, increased frequency or urgency, flank pain, loss of bowel or bladder function.  No LMP for male patient.  ROS: As per HPI.  Past Medical History:  Diagnosis Date  . Hypertension   . Obesity   . Obesity    History reviewed. No pertinent surgical history. No Known Allergies No current facility-administered medications on file prior to encounter.    No current outpatient medications on file prior to encounter.   Social History   Socioeconomic History  . Marital status: Married    Spouse name: Not on file  . Number of children: Not on file  . Years of education: Not on file  . Highest education level: Not on file  Occupational History  . Not on file  Social Needs  . Financial resource strain: Not on file  . Food insecurity:    Worry: Not on file    Inability: Not on file  . Transportation needs:    Medical: Not on file    Non-medical: Not on file  Tobacco Use  . Smoking status: Current Some Day Smoker    Types: Cigarettes  . Smokeless tobacco: Former Engineer, water and Sexual Activity  . Alcohol use: Yes    Comment: occ  . Drug use: Not Currently    Types: Marijuana    Comment: occ  . Sexual activity: Yes  Lifestyle  . Physical activity:    Days per week: Not on file     Minutes per session: Not on file  . Stress: Not on file  Relationships  . Social connections:    Talks on phone: Not on file    Gets together: Not on file    Attends religious service: Not on file    Active member of club or organization: Not on file    Attends meetings of clubs or organizations: Not on file    Relationship status: Not on file  . Intimate partner violence:    Fear of current or ex partner: Not on file    Emotionally abused: Not on file    Physically abused: Not on file    Forced sexual activity: Not on file  Other Topics Concern  . Not on file  Social History Narrative  . Not on file   No family history on file.   OBJECTIVE:  Vitals:   01/19/18 1140  BP: 134/90  Pulse: 75  Resp: 16  Temp: 97.6 F (36.4 C)  TempSrc: Temporal  SpO2: 100%    General appearance: AOx3 in no acute distress HEENT: NCAT.  Oropharynx clear.  Lungs: clear to auscultation bilaterally without adventitious breath sounds Heart: regular rate and rhythm.  Radial pulses 2+ symmetrical bilaterally Abdomen: soft, protuberant non-distended; normal active bowel sounds; non-tender to light and deep palpation; nontender at McBurney's point; no guarding Back: no CVA tenderness Extremities: no  edema; symmetrical with no gross deformities Skin: warm and dry Neurologic: normal gait Psychological: alert and cooperative; normal mood and affect  ASSESSMENT & PLAN:  1. Loose stools   2. Viral gastroenteritis     Meds ordered this encounter  Medications  . ondansetron (ZOFRAN) 4 MG tablet    Sig: Take 1 tablet (4 mg total) by mouth every 6 (six) hours as needed for nausea.    Dispense:  12 tablet    Refill:  0    Order Specific Question:   Supervising Provider    Answer:   Isa RankinMURRAY, LAURA WILSON [[id number]]     Get rest and drink fluids Zofran prescribed.  Take as directed.   DIET Instructions: 30 minutes after taking nausea medicine, begin with sips of clear liquids. If able to hold  down 2 - 4 ounces for 30 minutes, begin drinking more. Increase your fluid intake to replace losses. Clear liquids only for 24 hours (water, tea, sport drinks, clear flat ginger ale or cola and juices, broth, jello, popsicles, ect). Advance to bland foods, applesauce, rice, baked or boiled chicken, ect. Avoid milk, greasy foods and anything that doesn't agree with you.  If symptoms persists follow up with PCP If you experience new or worsening symptoms return or go to ER If you are unable to hold fluids or diarrhea persists more than 4 days, or becomes bloody, or if you develop high fever or abdominal pain, return here, see your doctor or go to the ER.   Reviewed expectations re: course of current medical issues. Questions answered. Outlined signs and symptoms indicating need for more acute intervention. Patient verbalized understanding. After Visit Summary given.   Rennis HardingWurst, Kataleya Zaugg, PA-C 01/19/18 1240

## 2018-01-19 NOTE — Discharge Instructions (Signed)
Get rest and drink fluids Zofran prescribed.  Take as directed.   DIET Instructions: 30 minutes after taking nausea medicine, begin with sips of clear liquids. If able to hold down 2 - 4 ounces for 30 minutes, begin drinking more. Increase your fluid intake to replace losses. Clear liquids only for 24 hours (water, tea, sport drinks, clear flat ginger ale or cola and juices, broth, jello, popsicles, ect). Advance to bland foods, applesauce, rice, baked or boiled chicken, ect. Avoid milk, greasy foods and anything that doesn?t agree with you.  If symptoms persists follow up with PCP If you experience new or worsening symptoms return or go to ER If you are unable to hold fluids or diarrhea persists more than 4 days, or becomes bloody, or if you develop high fever or abdominal pain, return here, see your doctor or go to the ER.

## 2018-01-19 NOTE — ED Triage Notes (Signed)
States he went to a cookout yest and now is having abd. Pain . Denies n/v c/o diarrhea.

## 2018-03-14 ENCOUNTER — Other Ambulatory Visit: Payer: Self-pay

## 2018-03-14 ENCOUNTER — Encounter (HOSPITAL_COMMUNITY): Payer: Self-pay

## 2018-03-14 ENCOUNTER — Ambulatory Visit (HOSPITAL_COMMUNITY)
Admission: EM | Admit: 2018-03-14 | Discharge: 2018-03-14 | Disposition: A | Discharge status: Home / Self Care | Payer: BC Managed Care – PPO | Attending: Family Medicine | Admitting: Family Medicine

## 2018-03-14 DIAGNOSIS — R195 Other fecal abnormalities: Secondary | ICD-10-CM

## 2018-03-14 NOTE — ED Triage Notes (Signed)
Pt states he cooked a roast last night and sometime last night his stomach started hurting and diarrhea.

## 2018-03-14 NOTE — ED Provider Notes (Signed)
[facility] CARE CENTER   [id number] 03/14/18 Arrival Time: [phone]  CC: abdominal discomfort, and looser stools  SUBJECTIVE:  Adam Bush is a 37 y.o. male who presents with complaint of intermittent abdominal cramping, and looser stools that began last night.  Symptoms began after eating a roast last night.  Reports x1 episode of "diarrhea" with looser stools.  Has not tried OTC medications, just diet restrictions with minimal relief.  Denies alleviating or aggravating factors.  Denies similar symptoms in the past.  Last BM this morning.  Complains of decreased appetite  Denies fever, chills, weight changes, nausea, vomiting, chest pain, SOB, constipation, hematochezia, melena, dysuria, difficulty urinating, increased frequency or urgency, flank pain, loss of bowel or bladder function.  No LMP for male patient.  ROS: As per HPI.  Past Medical History:  Diagnosis Date  . Hypertension   . Obesity   . Obesity    History reviewed. No pertinent surgical history. No Known Allergies No current facility-administered medications on file prior to encounter.    Current Outpatient Medications on File Prior to Encounter  Medication Sig Dispense Refill  . ondansetron (ZOFRAN) 4 MG tablet Take 1 tablet (4 mg total) by mouth every 6 (six) hours as needed for nausea. (Patient not taking: Reported on 03/14/2018) 12 tablet 0   Social History   Socioeconomic History  . Marital status: Married    Spouse name: Not on file  . Number of children: Not on file  . Years of education: Not on file  . Highest education level: Not on file  Occupational History  . Not on file  Social Needs  . Financial resource strain: Not on file  . Food insecurity:    Worry: Not on file    Inability: Not on file  . Transportation needs:    Medical: Not on file    Non-medical: Not on file  Tobacco Use  . Smoking status: Current Some Day Smoker    Types: Cigarettes  . Smokeless tobacco: Former Engineer, water  and Sexual Activity  . Alcohol use: Yes    Comment: occ  . Drug use: Not Currently    Types: Marijuana    Comment: occ  . Sexual activity: Yes  Lifestyle  . Physical activity:    Days per week: Not on file    Minutes per session: Not on file  . Stress: Not on file  Relationships  . Social connections:    Talks on phone: Not on file    Gets together: Not on file    Attends religious service: Not on file    Active member of club or organization: Not on file    Attends meetings of clubs or organizations: Not on file    Relationship status: Not on file  . Intimate partner violence:    Fear of current or ex partner: Not on file    Emotionally abused: Not on file    Physically abused: Not on file    Forced sexual activity: Not on file  Other Topics Concern  . Not on file  Social History Narrative  . Not on file   History reviewed. No pertinent family history.   OBJECTIVE:  Vitals:   03/14/18 0833 03/14/18 0836  BP: 138/86   Pulse: 84   Resp: 18   Temp: 98 F (36.7 C)   SpO2: 97%   Weight:  (!) 310 lb (140.6 kg)    General appearance: AOx3 in no acute distress; nontoxic appearance HEENT: NCAT.  Oropharynx clear.  Lungs: clear to auscultation bilaterally without adventitious breath sounds Heart: regular rate and rhythm.  Radial pulses 2+ symmetrical bilaterally Abdomen: protuberant, soft, non-distended; normal active bowel sounds; non-tender to light and deep palpation; nontender at McBurney's point; no guarding Extremities: no edema; symmetrical with no gross deformities Skin: warm and dry Neurologic: normal gait Psychological: alert and cooperative; normal mood and affect  ASSESSMENT & PLAN:  1. Loose stools     No orders of the defined types were placed in this encounter.  Work note given DIET Instructions:  Increase your fluid intake to replace losses. Clear liquids only for 24 hours (water, tea, sport drinks, clear flat ginger ale or cola and juices,  broth, jello, popsicles, ect). Advance to bland foods, applesauce, rice, baked or boiled chicken, ect. Avoid milk, greasy foods and anything that doesn't agree with you. If you experience new or worsening symptoms return or go to ER If diarrhea persists more than 4 days, or becomes bloody, or if you develop high fever or abdominal pain, return here, see your doctor or go to the ER.    Reviewed expectations re: course of current medical issues. Questions answered. Outlined signs and symptoms indicating need for more acute intervention. Patient verbalized understanding. After Visit Summary given.   Rennis HardingWurst, Layth Cerezo, PA-C 03/14/18 [phone]

## 2018-03-14 NOTE — Discharge Instructions (Addendum)
DIET Instructions:  Increase your fluid intake to replace losses. Clear liquids only for 24 hours (water, tea, sport drinks, clear flat ginger ale or cola and juices, broth, jello, popsicles, ect). Advance to bland foods, applesauce, rice, baked or boiled chicken, ect. Avoid milk, greasy foods and anything that doesnt agree with you. If you experience new or worsening symptoms return or go to ER If diarrhea persists more than 4 days, or becomes bloody, or if you develop high fever or abdominal pain, return here, see your doctor or go to the ER.

## 2018-03-28 ENCOUNTER — Ambulatory Visit (HOSPITAL_COMMUNITY)
Admission: EM | Admit: 2018-03-28 | Discharge: 2018-03-28 | Disposition: A | Discharge status: Home / Self Care | Payer: BC Managed Care – PPO | Attending: Family Medicine | Admitting: Family Medicine

## 2018-03-28 ENCOUNTER — Other Ambulatory Visit: Payer: Self-pay

## 2018-03-28 DIAGNOSIS — R0981 Nasal congestion: Secondary | ICD-10-CM | POA: Diagnosis not present

## 2018-03-28 DIAGNOSIS — J029 Acute pharyngitis, unspecified: Secondary | ICD-10-CM | POA: Insufficient documentation

## 2018-03-28 DIAGNOSIS — F17210 Nicotine dependence, cigarettes, uncomplicated: Secondary | ICD-10-CM | POA: Insufficient documentation

## 2018-03-28 LAB — POCT RAPID STREP A: STREPTOCOCCUS, GROUP A SCREEN (DIRECT): NEGATIVE

## 2018-03-28 MED ORDER — FLUTICASONE PROPIONATE 50 MCG/ACT NA SUSP: 2.0000 | Freq: Every day | NASAL | 0 refills | Status: DC

## 2018-03-28 MED ORDER — CETIRIZINE-PSEUDOEPHEDRINE ER 5-120 MG PO TB12: 1.0000 | ORAL_TABLET | Freq: Every day | ORAL | 0 refills | Status: DC

## 2018-03-28 NOTE — ED Provider Notes (Signed)
[facility] CARE CENTER   [id number] 03/28/18 Arrival Time: 0901  XB:MWUX THROAT  SUBJECTIVE: History from: patient.  Adam Bush is a 37 y.o. male who presents with abrupt onset of nasal congestion, sinus pressure, and sore throat x 2 days .  Admits to positive sick exposure.  Has tried increased water intake, and using cool air with minimal relief.  Denies aggravating factors.  Reports previous symptoms in the past. Denies fever, chills, fatigue, sinus pain, rhinorrhea, cough, SOB, wheezing, chest pain, nausea, rash, changes in bowel or bladder habits.    ROS: As per HPI.  Past Medical History:  Diagnosis Date  . Hypertension   . Obesity   . Obesity    No past surgical history on file. No Known Allergies No current facility-administered medications on file prior to encounter.    No current outpatient medications on file prior to encounter.   Social History   Socioeconomic History  . Marital status: Married    Spouse name: Not on file  . Number of children: Not on file  . Years of education: Not on file  . Highest education level: Not on file  Occupational History  . Not on file  Social Needs  . Financial resource strain: Not on file  . Food insecurity:    Worry: Not on file    Inability: Not on file  . Transportation needs:    Medical: Not on file    Non-medical: Not on file  Tobacco Use  . Smoking status: Current Some Day Smoker    Types: Cigarettes  . Smokeless tobacco: Former Engineer, water and Sexual Activity  . Alcohol use: Yes    Comment: occ  . Drug use: Not Currently    Types: Marijuana    Comment: occ  . Sexual activity: Yes  Lifestyle  . Physical activity:    Days per week: Not on file    Minutes per session: Not on file  . Stress: Not on file  Relationships  . Social connections:    Talks on phone: Not on file    Gets together: Not on file    Attends religious service: Not on file    Active member of club or organization: Not on file     Attends meetings of clubs or organizations: Not on file    Relationship status: Not on file  . Intimate partner violence:    Fear of current or ex partner: Not on file    Emotionally abused: Not on file    Physically abused: Not on file    Forced sexual activity: Not on file  Other Topics Concern  . Not on file  Social History Narrative  . Not on file   No family history on file.  OBJECTIVE:  Vitals:   03/28/18 0911 03/28/18 0912  BP:  (!) 147/96  Pulse:  90  Resp:  18  Temp:  98.3 F (36.8 C)  TempSrc:  Oral  SpO2:  98%  Weight: (!) 301 lb (136.5 kg)      General appearance: alert; appears fatigued; nontoxic appearance HEENT: Ears: EACs clear, TMs pearly gray; Eyes: PERRL.  EOM grossly intact.  Sinuses nontender; Nose: mild clear rhinorrhea with erythematous turbinates; tonsils nonerythematous, uvula midline Neck: supple without LAD Lungs: CTA bilaterally without adventitious breath sounds Heart: regular rate and rhythm.  Radial pulses 2+ symmetrical bilaterally Skin: warm and dry Psychological: alert and cooperative; normal mood and affect  Labs: Results for orders placed or performed during the hospital  encounter of 03/28/18 (from the past 24 hour(s))  POCT rapid strep A [facility])     Status: None   Collection Time: 03/28/18  9:26 AM  Result Value Ref Range   Streptococcus, Group A Screen (Direct) NEGATIVE NEGATIVE     ASSESSMENT & PLAN:  1. Nasal sinus congestion   2. Sore throat     Meds ordered this encounter  Medications  . cetirizine-pseudoephedrine (ZYRTEC-D) 5-120 MG tablet    Sig: Take 1 tablet by mouth daily.    Dispense:  30 tablet    Refill:  0    Order Specific Question:   Supervising Provider    Answer:   Isa Rankin [phone]  . fluticasone (FLONASE) 50 MCG/ACT nasal spray    Sig: Place 2 sprays into both nostrils daily.    Dispense:  16 g    Refill:  0    Order Specific Question:   Supervising Provider    Answer:    Isa Rankin [[id number]]   Strep test negative, will send out for culture and we will call you with results Get plenty of rest and push fluids Zyrtec D prescribed.  Take daily for symptomatic relief Flonase prescribed.  Use daily for symptomatic relief Follow up with PCP if symptoms persists Return or go to ER if patient has any new or worsening symptoms   Reviewed expectations re: course of current medical issues. Questions answered. Outlined signs and symptoms indicating need for more acute intervention. Patient verbalized understanding. After Visit Summary given.        Rennis Harding, PA-C 03/28/18 0945

## 2018-03-28 NOTE — Discharge Instructions (Addendum)
Strep test negative, will send out for culture and we will call you with results Get plenty of rest and push fluids Zyrtec D prescribed.  Take daily for symptomatic relief Flonase prescribed.  Use daily for symptomatic relief Follow up with PCP if symptoms persists Return or go to ER if patient has any new or worsening symptoms

## 2018-03-30 LAB — CULTURE, GROUP A STREP (THRC)

## 2018-06-18 ENCOUNTER — Ambulatory Visit (HOSPITAL_COMMUNITY)
Admission: EM | Admit: 2018-06-18 | Discharge: 2018-06-18 | Disposition: A | Discharge status: Home / Self Care | Payer: BC Managed Care – PPO | Attending: Family Medicine | Admitting: Family Medicine

## 2018-06-18 ENCOUNTER — Encounter (HOSPITAL_COMMUNITY): Payer: Self-pay

## 2018-06-18 DIAGNOSIS — R1013 Epigastric pain: Secondary | ICD-10-CM

## 2018-06-18 NOTE — ED Triage Notes (Signed)
Pt presents with complaints of aching abdominal pain after having milk over the holidays. Reports history of same. Continues for x 2 days.

## 2018-06-18 NOTE — Discharge Instructions (Addendum)
Continue treatment as you have done in the past to help with the symptoms.  Monitor for worsening symptoms No milk products in the future.  Work note given.  Follow up as needed for continued or worsening symptoms

## 2018-06-18 NOTE — ED Provider Notes (Signed)
[facility]    CSN: [id number] Arrival date & time: 06/18/18  [id number]     History   Chief Complaint Chief Complaint  Patient presents with  . Abdominal Pain    HPI Adam Bush is a 37 y.o. male.   Patient is a 37 year old male that presents with abdominal cramping, loose stools.  This started approximately 2 days ago and has been waxing and waning.  He has seen some improvement in symptoms.  This has occurred in the past after consuming milk or milk related products.  He did have some milk related products on Thanksgiving.  He denies any nausea, vomiting.  He denies any fever, chills, recent traveling. He has not taken anything for symptoms. He has been drinking water.   ROS per HPI      Past Medical History:  Diagnosis Date  . Hypertension   . Obesity   . Obesity     There are no active problems to display for this patient.   History reviewed. No pertinent surgical history.     Home Medications    Prior to Admission medications   Medication Sig Start Date End Date Taking? Authorizing Provider  cetirizine-pseudoephedrine (ZYRTEC-D) 5-120 MG tablet Take 1 tablet by mouth daily. 03/28/18   Wurst, Gren[place]Brittany, PA-C  fluticasone (FLONASE) 50 MCG/ACT nasal spray Place 2 sprays into both nostrils daily. 03/28/18   Rennis HardingWurst, Brittany, PA-C    Family History Family History  Problem Relation Age of Onset  . Healthy Mother   . Healthy Sister     Social History Social History   Tobacco Use  . Smoking status: Current Some Day Smoker    Types: Cigarettes  . Smokeless tobacco: Former Engineer, waterUser  Substance Use Topics  . Alcohol use: Yes    Comment: occ  . Drug use: Not Currently    Types: Marijuana    Comment: occ     Allergies   Patient has no known allergies.   Review of Systems Review of Systems   Physical Exam Triage Vital Signs ED Triage Vitals  Enc Vitals Group     BP 06/18/18 0959 (!) 128/91     Pulse Rate 06/18/18 0959 78     Resp  06/18/18 0959 17     Temp 06/18/18 0959 98.1 F (36.7 C)     Temp src --      SpO2 06/18/18 0959 98 %     Weight --      Height --      Head Circumference --      Peak Flow --      Pain Score 06/18/18 0958 6     Pain Loc --      Pain Edu? --      Excl. in [place]? --    No data found.  Updated Vital Signs BP (!) 128/91   Pulse 78   Temp 98.1 F (36.7 C)   Resp 17   SpO2 98%   Visual Acuity Right Eye Distance:   Left Eye Distance:   Bilateral Distance:    Right Eye Near:   Left Eye Near:    Bilateral Near:     Physical Exam  Constitutional: He appears well-developed and well-nourished.  Non-toxic appearance. He does not appear ill.  HENT:  Head: Normocephalic.  Cardiovascular: Normal rate.  Pulmonary/Chest: Effort normal.  Abdominal: Soft. Normal appearance, normal aorta and bowel sounds are normal.  Mildly tender to epigastric and right upper quadrant area. Negative Murphy sign No  masses or rebound tenderness  Neurological: He is alert.  Skin: Skin is warm and dry.  Psychiatric: He has a normal mood and affect.  Nursing note and vitals reviewed.    UC Treatments / Results  Labs (all labs ordered are listed, but only abnormal results are displayed) Labs Reviewed - No data to display  EKG None  Radiology No results found.  Procedures Procedures (including critical care time)  Medications Ordered in UC Medications - No data to display  Initial Impression / Assessment and Plan / UC Course  I have reviewed the triage vital signs and the nursing notes.  Pertinent labs & imaging results that were available during my care of the patient were reviewed by me and considered in my medical decision making (see chart for details).    No acute abdomen.  Most likely symptoms are related to consumption of milk products. He has had this previously His symptoms have improved. He is requesting a work note Follow up as needed for continued or worsening  symptoms  Final Clinical Impressions(s) / UC Diagnoses   Final diagnoses:  Epigastric pain     Discharge Instructions     Continue treatment as you have done in the past to help with the symptoms.  Monitor for worsening symptoms No milk products in the future.  Work note given.  Follow up as needed for continued or worsening symptoms     ED Prescriptions    None     Controlled Substance Prescriptions [place] Controlled Substance Registry consulted? Not Applicable   Janace Aris, NP 06/18/18 1023

## 2018-06-22 ENCOUNTER — Ambulatory Visit (HOSPITAL_COMMUNITY)
Admission: EM | Admit: 2018-06-22 | Discharge: 2018-06-22 | Disposition: A | Discharge status: Home / Self Care | Payer: BC Managed Care – PPO | Attending: Family Medicine | Admitting: Family Medicine

## 2018-06-22 ENCOUNTER — Encounter (HOSPITAL_COMMUNITY): Payer: Self-pay

## 2018-06-22 DIAGNOSIS — J069 Acute upper respiratory infection, unspecified: Secondary | ICD-10-CM

## 2018-06-22 DIAGNOSIS — R030 Elevated blood-pressure reading, without diagnosis of hypertension: Secondary | ICD-10-CM

## 2018-06-22 NOTE — ED Provider Notes (Signed)
[facility]    CSN: [id number] Arrival date & time: 06/22/18  2130     History   Chief Complaint Chief Complaint  Patient presents with  . Nasal Congestion    HPI Adam Bush is a 37 y.o. male.   37 year old male presents with nasal congestion for the past 2 to 3 days. Also having slight cough and irritated throat but no fever, nausea or vomiting. He had diarrhea from eating diary and diary-substitutes earlier in the week but has improved now. He has been increasing fluids and taking Mucinex with some relief. Needs note to return to work. Has history of occasional elevated blood pressure but has improved since changing diet and losing weight. He is uncertain if the Mucinex he is taking has a decongestant in it which could effect his blood pressure. Otherwise no other chronic health issues. Takes no daily medication.   The history is provided by the patient.    Past Medical History:  Diagnosis Date  . Hypertension   . Obesity   . Obesity     There are no active problems to display for this patient.   History reviewed. No pertinent surgical history.     Home Medications    Prior to Admission medications   Not on File    Family History Family History  Problem Relation Age of Onset  . Healthy Mother   . Healthy Sister     Social History Social History   Tobacco Use  . Smoking status: Current Some Day Smoker    Types: Cigarettes  . Smokeless tobacco: Former Engineer, water Use Topics  . Alcohol use: Yes    Comment: occ  . Drug use: Not Currently    Types: Marijuana    Comment: occ     Allergies   Patient has no known allergies.   Review of Systems Review of Systems  Constitutional: Negative for activity change, appetite change, chills, diaphoresis, fatigue and fever.  HENT: Positive for congestion, postnasal drip and rhinorrhea. Negative for ear discharge, ear pain, facial swelling, mouth sores, nosebleeds, sinus pressure, sinus  pain, sneezing, sore throat and trouble swallowing.   Eyes: Negative for pain, discharge, redness and itching.  Respiratory: Positive for cough. Negative for chest tightness, shortness of breath and wheezing.   Gastrointestinal: Positive for diarrhea (resolving). Negative for abdominal pain, nausea and vomiting.  Musculoskeletal: Negative for arthralgias, myalgias, neck pain and neck stiffness.  Skin: Negative for color change, rash and wound.  Neurological: Negative for dizziness, tremors, seizures, syncope, weakness, light-headedness, numbness and headaches.  Hematological: Negative for adenopathy. Does not bruise/bleed easily.     Physical Exam Triage Vital Signs ED Triage Vitals  Enc Vitals Group     BP 06/22/18 1011 (!) 140/100     Pulse Rate 06/22/18 1011 80     Resp 06/22/18 1011 18     Temp 06/22/18 1011 98.4 F (36.9 C)     Temp src --      SpO2 06/22/18 1011 99 %     Weight --      Height --      Head Circumference --      Peak Flow --      Pain Score 06/22/18 1010 0     Pain Loc --      Pain Edu? --      Excl. in [place]? --    No data found.  Updated Vital Signs BP (!) 140/100   Pulse 80  Temp 98.4 F (36.9 C)   Resp 18   SpO2 99%   Visual Acuity Right Eye Distance:   Left Eye Distance:   Bilateral Distance:    Right Eye Near:   Left Eye Near:    Bilateral Near:     Physical Exam  Constitutional: He is oriented to person, place, and time. He appears well-developed and well-nourished. He is cooperative. He does not appear ill. No distress.  Patient is sitting comfortably on exam table in no acute distress.   HENT:  Head: Normocephalic and atraumatic.  Right Ear: Hearing, tympanic membrane, external ear and ear canal normal.  Left Ear: Hearing, tympanic membrane, external ear and ear canal normal.  Nose: Mucosal edema and rhinorrhea present. Right sinus exhibits no maxillary sinus tenderness and no frontal sinus tenderness. Left sinus exhibits no  maxillary sinus tenderness and no frontal sinus tenderness.  Mouth/Throat: Uvula is midline and mucous membranes are normal. Oropharyngeal exudate (slight clear to yellowish post nasal drainage present) present.  Eyes: Conjunctivae and EOM are normal.  Neck: Normal range of motion. Neck supple.  Cardiovascular: Normal rate, regular rhythm and normal heart sounds.  No murmur heard. Pulmonary/Chest: Effort normal and breath sounds normal. No respiratory distress. He has no decreased breath sounds. He has no wheezes. He has no rhonchi. He has no rales.  Musculoskeletal: Normal range of motion.  Lymphadenopathy:    He has no cervical adenopathy.  Neurological: He is alert and oriented to person, place, and time.  Skin: Skin is warm and dry. Capillary refill takes less than 2 seconds. No rash noted.  Psychiatric: He has a normal mood and affect. His behavior is normal. Judgment and thought content normal.  Vitals reviewed.    UC Treatments / Results  Labs (all labs ordered are listed, but only abnormal results are displayed) Labs Reviewed - No data to display  EKG None  Radiology No results found.  Procedures Procedures (including critical care time)  Medications Ordered in UC Medications - No data to display  Initial Impression / Assessment and Plan / UC Course  I have reviewed the triage vital signs and the nursing notes.  Pertinent labs & imaging results that were available during my care of the patient were reviewed by me and considered in my medical decision making (see chart for details).    Reviewed with patient that he probably has a viral URI. Since symptoms are improving, may continue Mucinex as directed. Recommend read medication label- avoid products with pseudoephedrine which can raise blood pressure. Continue to monitor BP. Encouraged to continue to push fluids to help loosen up mucus. Note written that he can return to work on Monday 06/25/18. Follow-up in 4 to 5 days  if not improving.   Final Clinical Impressions(s) / UC Diagnoses   Final diagnoses:  Upper respiratory tract infection, unspecified type  Elevated blood pressure reading     Discharge Instructions     Recommend continue Mucinex as directed- may cause your blood pressure to increase so continue to monitor. Encouraged to continue to push fluids to help loosen up mucus. Follow-up in 4 to 5 days if not improving.     ED Prescriptions    None     Controlled Substance Prescriptions [place] Controlled Substance Registry consulted? Not Applicable   Sudie Grumblingmyot, Elya Diloreto Berry, NP 06/22/18 2123

## 2018-06-22 NOTE — Discharge Instructions (Addendum)
Recommend continue Mucinex as directed- may cause your blood pressure to increase so continue to monitor. Encouraged to continue to push fluids to help loosen up mucus. Follow-up in 4 to 5 days if not improving.

## 2018-06-22 NOTE — ED Triage Notes (Signed)
Pt presents with nasal congestion x 2 days. States he needs a note for work before he can return.

## 2019-02-06 IMAGING — CR DG KNEE COMPLETE 4+V*R*
4 series · 4 of 4 positions shown · non-contrast
Comparison: None.

CLINICAL DATA: Right knee pain after falling down steps.

EXAM:
RIGHT KNEE - COMPLETE 4+ VIEW

[knee ap]
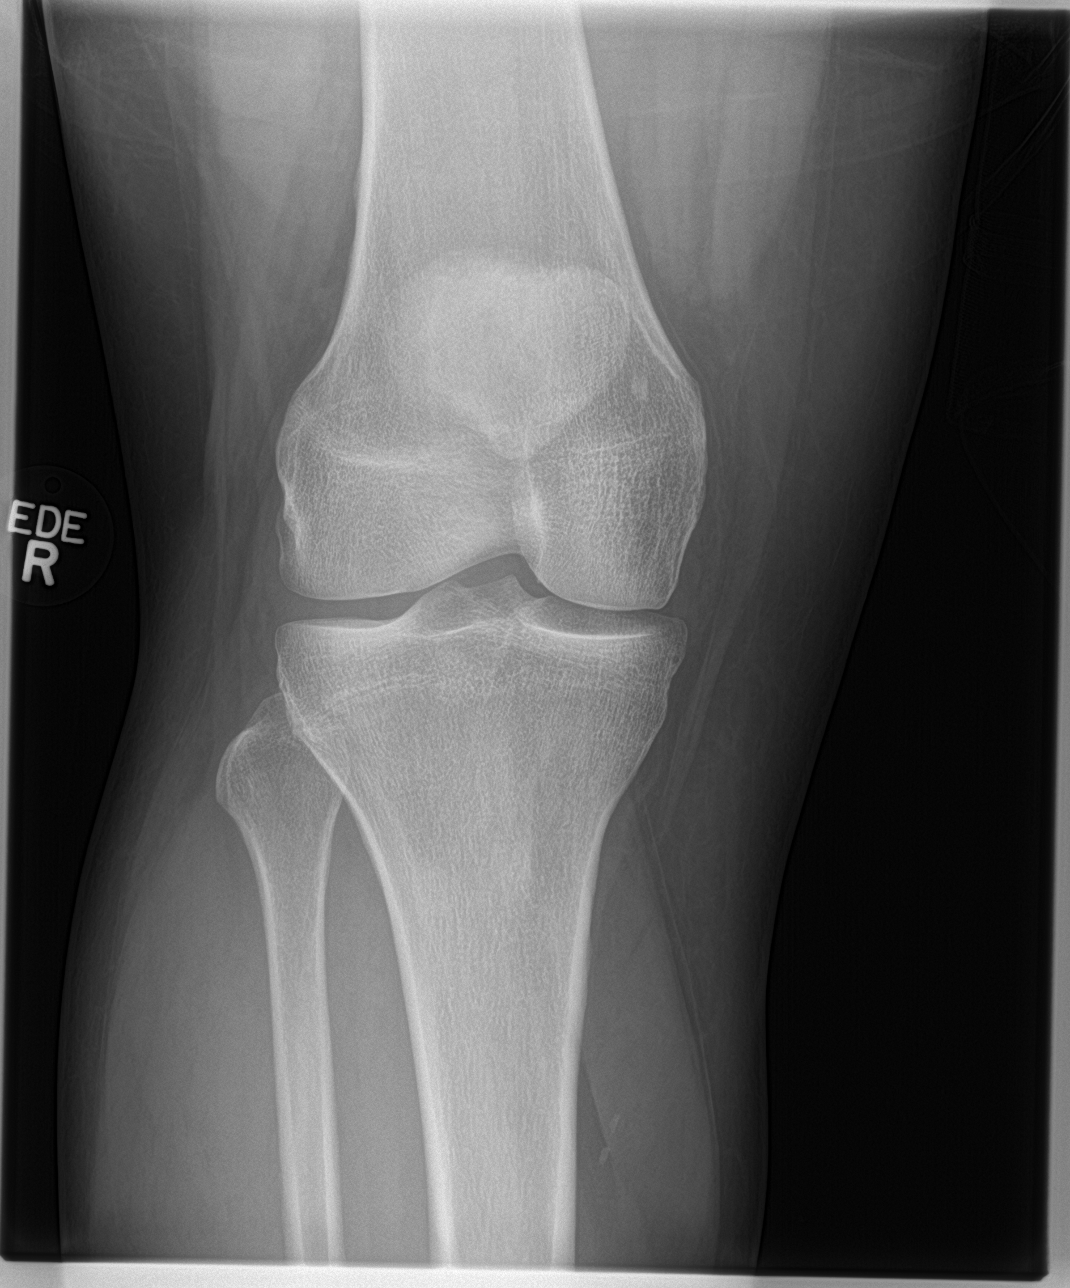

[knee lat]
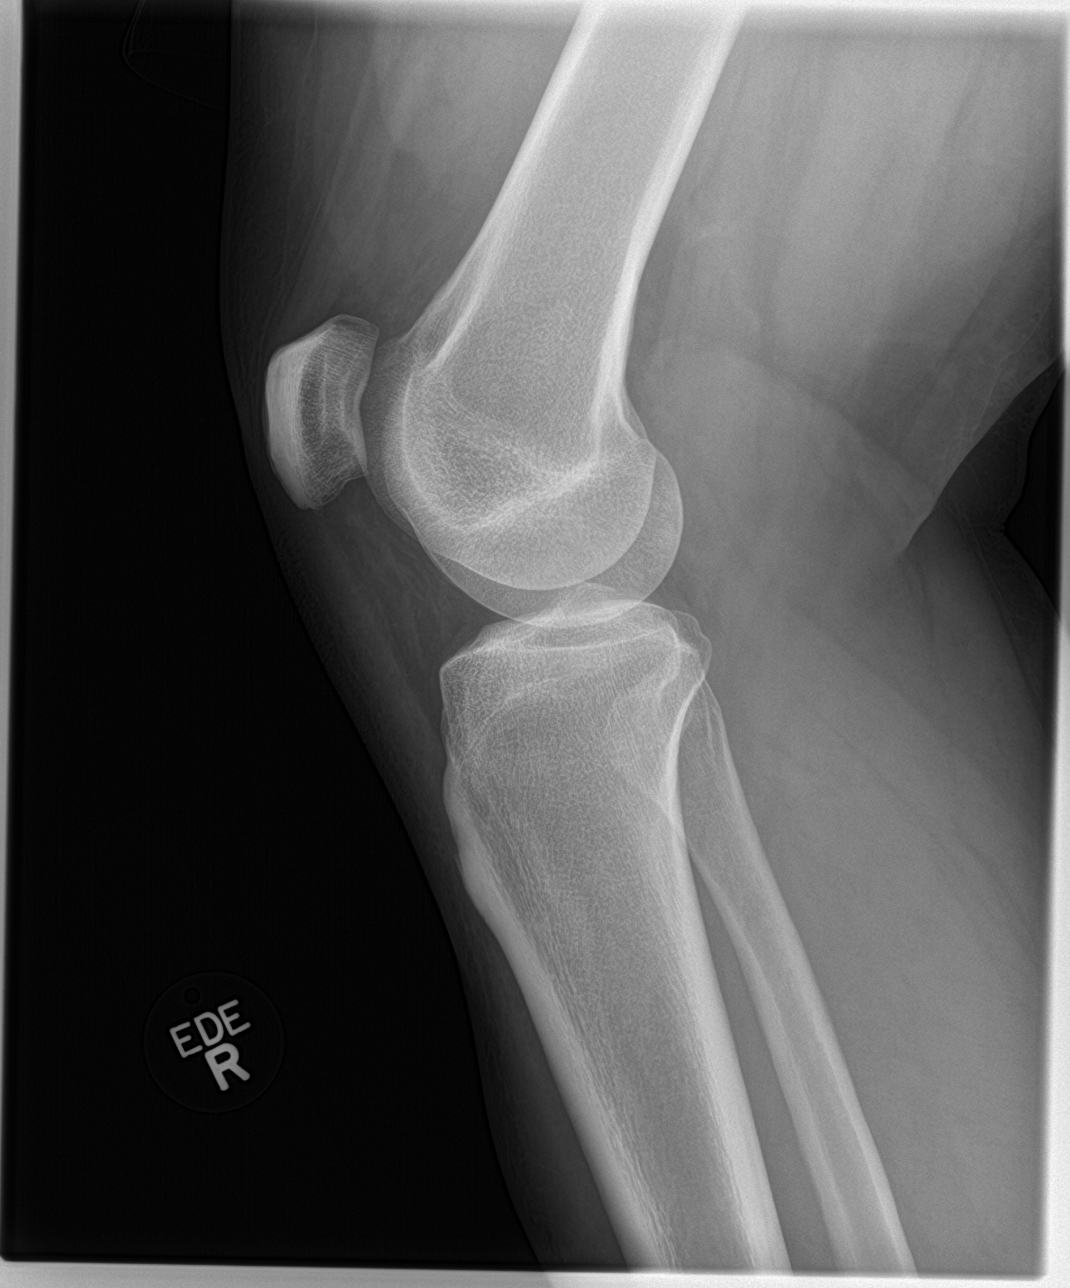

[knee obl (1 of 2)]
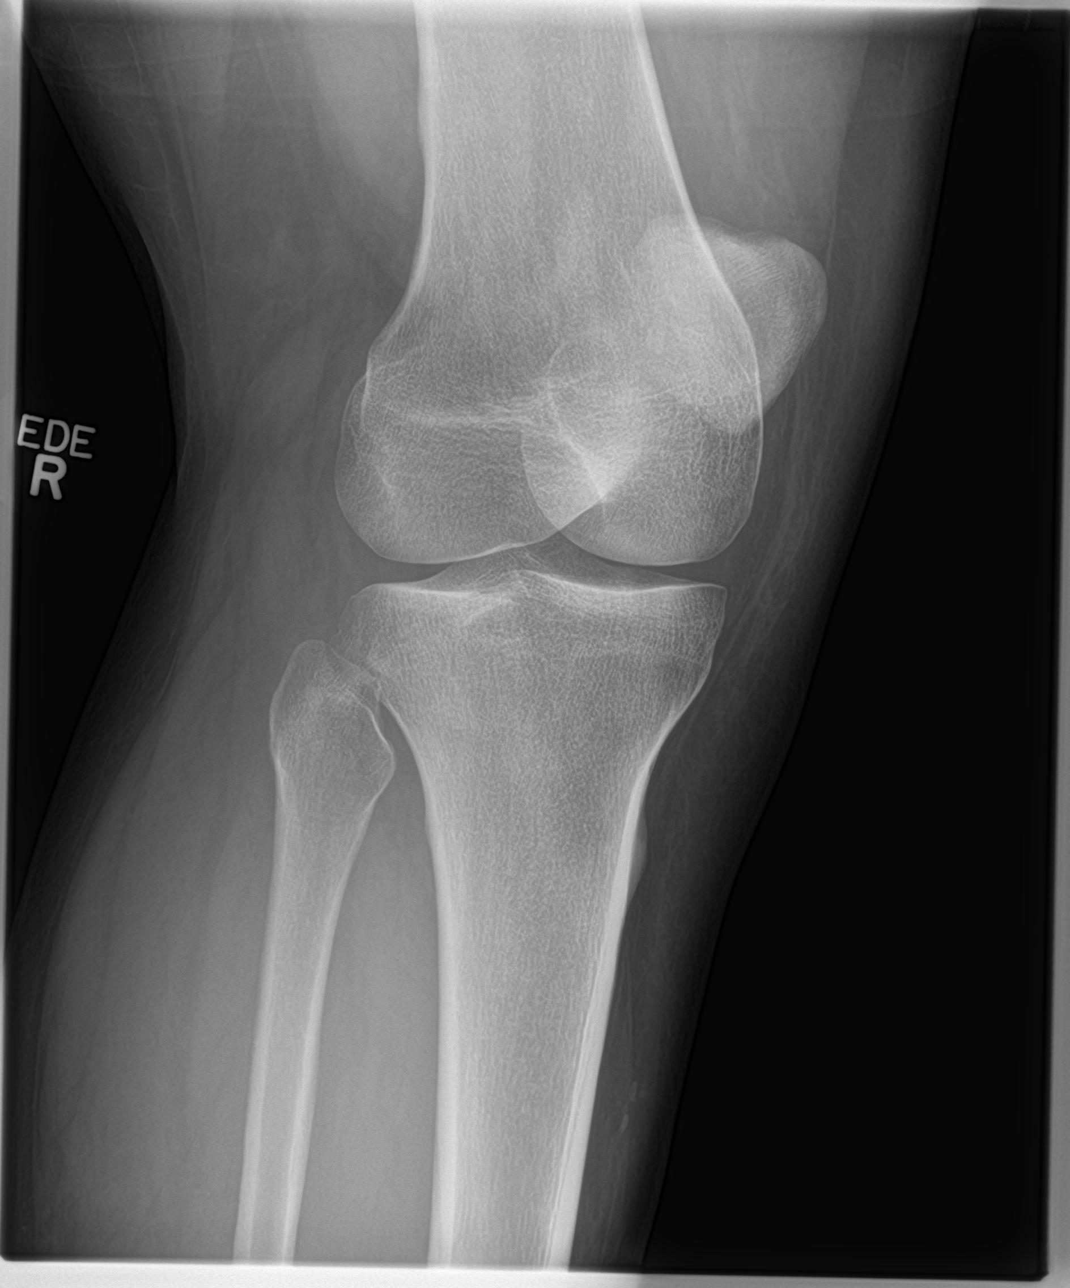

[knee obl (2 of 2)]
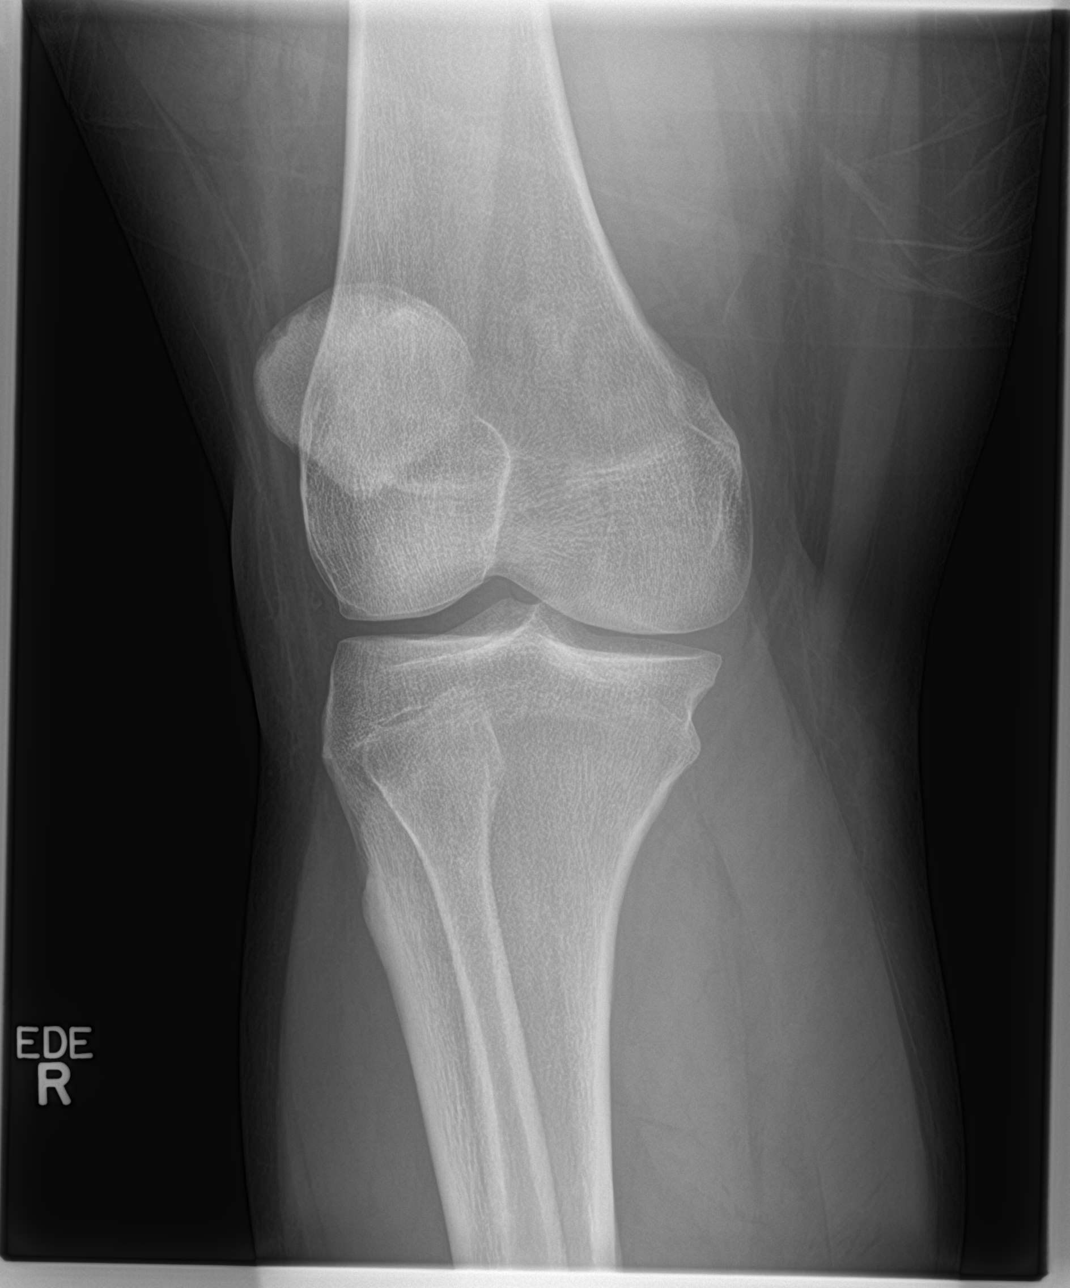

[4 of 4 positions shown; findings below may reference images not displayed]

FINDINGS: No evidence of fracture, dislocation, or joint effusion. No evidence
of arthropathy or other focal bone abnormality. Soft tissues are
unremarkable.
IMPRESSION: Negative.

## 2019-03-28 ENCOUNTER — Encounter (HOSPITAL_COMMUNITY): Payer: Self-pay

## 2019-03-28 ENCOUNTER — Ambulatory Visit (HOSPITAL_COMMUNITY)
Admission: EM | Admit: 2019-03-28 | Discharge: 2019-03-28 | Disposition: A | Discharge status: Home / Self Care | Payer: BC Managed Care – PPO | Attending: Internal Medicine | Admitting: Internal Medicine

## 2019-03-28 ENCOUNTER — Other Ambulatory Visit: Payer: Self-pay

## 2019-03-28 DIAGNOSIS — K279 Peptic ulcer, site unspecified, unspecified as acute or chronic, without hemorrhage or perforation: Secondary | ICD-10-CM | POA: Diagnosis not present

## 2019-03-28 MED ORDER — ONDANSETRON 4 MG PO TBDP: 4.0000 mg | ORAL_TABLET | Freq: Three times a day (TID) | ORAL | 0 refills | Status: AC | PRN

## 2019-03-28 MED ORDER — PANTOPRAZOLE SODIUM 20 MG PO TBEC: 20.0000 mg | DELAYED_RELEASE_TABLET | Freq: Two times a day (BID) | ORAL | 2 refills | Status: AC

## 2019-03-28 NOTE — ED Triage Notes (Signed)
Pt presents with nausea and vomiting; 2 episodes since Tuesday.

## 2019-03-28 NOTE — ED Provider Notes (Signed)
[facility]    CSN: [id number] Arrival date & time: 03/28/19  1302      History   Chief Complaint Chief Complaint  Patient presents with  . Vomiting    HPI Adam Bush is a 38 y.o. male with history of hypertension comes to urgent care with complaints of nausea and vomiting over the past couple of days.  Patient has a history of lactose intolerance as well as gastroesophageal reflux disease.  He denies any abdominal pain.  He admits to having epigastric discomfort associated with queasiness, nausea and nonbilious nonbloody vomitus.  No relieving factors.  No known aggravating factors.  Patient denies any shortness of breath, cough or sputum production.  No weight loss.  No change in bowel habits.  No melanotic stool.Marland Kitchen   HPI  Past Medical History:  Diagnosis Date  . Hypertension   . Obesity   . Obesity     There are no active problems to display for this patient.   History reviewed. No pertinent surgical history.     Home Medications    Prior to Admission medications   Medication Sig Start Date End Date Taking? Authorizing Provider  ondansetron (ZOFRAN ODT) 4 MG disintegrating tablet Take 1 tablet (4 mg total) by mouth every 8 (eight) hours as needed for nausea or vomiting. 03/28/19   Jmichael Gille, Myrene Galas, MD  pantoprazole (PROTONIX) 20 MG tablet Take 1 tablet (20 mg total) by mouth 2 (two) times daily. 03/28/19 04/27/19  Chase Picket, MD    Family History Family History  Problem Relation Age of Onset  . Healthy Mother   . Healthy Sister     Social History Social History   Tobacco Use  . Smoking status: Current Some Day Smoker    Types: Cigarettes  . Smokeless tobacco: Former Network engineer Use Topics  . Alcohol use: Yes    Comment: occ  . Drug use: Not Currently    Types: Marijuana    Comment: occ     Allergies   Patient has no known allergies.   Review of Systems Review of Systems  Constitutional: Negative for activity  change, chills, fatigue, fever and unexpected weight change.  HENT: Negative.   Respiratory: Negative for cough, choking, shortness of breath and stridor.   Cardiovascular: Negative for chest pain and palpitations.  Gastrointestinal: Positive for nausea and vomiting. Negative for abdominal distention, abdominal pain, blood in stool and diarrhea.  Genitourinary: Negative.   Musculoskeletal: Negative.   Skin: Negative.   Neurological: Negative for dizziness, weakness, light-headedness and headaches.     Physical Exam Triage Vital Signs ED Triage Vitals  Enc Vitals Group     BP 03/28/19 1317 132/89     Pulse Rate 03/28/19 1317 84     Resp 03/28/19 1317 16     Temp 03/28/19 1317 98.2 F (36.8 C)     Temp Source 03/28/19 1317 Temporal     SpO2 03/28/19 1317 99 %     Weight --      Height --      Head Circumference --      Peak Flow --      Pain Score 03/28/19 1320 4     Pain Loc --      Pain Edu? --      Excl. in [place]? --    No data found.  Updated Vital Signs BP 132/89 (BP Location: Left Arm)   Pulse 84   Temp 98.2 F (36.8 C) (Temporal)  Resp 16   SpO2 99%   Visual Acuity Right Eye Distance:   Left Eye Distance:   Bilateral Distance:    Right Eye Near:   Left Eye Near:    Bilateral Near:     Physical Exam Constitutional:      General: He is not in acute distress.    Appearance: He is obese. He is not ill-appearing or toxic-appearing.  HENT:     Right Ear: Tympanic membrane normal.     Left Ear: Tympanic membrane normal.     Mouth/Throat:     Mouth: Mucous membranes are moist.     Pharynx: No posterior oropharyngeal erythema.  Cardiovascular:     Rate and Rhythm: Normal rate and regular rhythm.     Pulses: Normal pulses.     Heart sounds: Normal heart sounds. No murmur. No friction rub.  Pulmonary:     Effort: Pulmonary effort is normal. No respiratory distress.     Breath sounds: Normal breath sounds. No wheezing or rhonchi.  Abdominal:     General:  Bowel sounds are normal. There is no distension.     Palpations: Abdomen is soft.     Tenderness: There is no abdominal tenderness. There is no guarding.  Musculoskeletal: Normal range of motion.        General: No swelling or tenderness.  Skin:    Capillary Refill: Capillary refill takes less than 2 seconds.     Coloration: Skin is not jaundiced.     Findings: No bruising or erythema.  Neurological:     General: No focal deficit present.     Mental Status: He is alert and oriented to person, place, and time.      UC Treatments / Results  Labs (all labs ordered are listed, but only abnormal results are displayed) Labs Reviewed - No data to display  EKG   Radiology No results found.  Procedures Procedures (including critical care time)  Medications Ordered in UC Medications - No data to display  Initial Impression / Assessment and Plan / UC Course  I have reviewed the triage vital signs and the nursing notes.  Pertinent labs & imaging results that were available during my care of the patient were reviewed by me and considered in my medical decision making (see chart for details).     1.  Peptic ulcer disease: Protonix 20 mg orally twice daily for 30 days Zofran as needed for nausea/vomiting If patient's symptoms does not improve after a month of Protonix, patient will benefit from gastroenterology consultation/referral. If patient symptoms worsens he is advised to return to urgent care to be reevaluated. Final Clinical Impressions(s) / UC Diagnoses   Final diagnoses:  Peptic ulcer disease   Discharge Instructions   None    ED Prescriptions    Medication Sig Dispense Auth. Provider   pantoprazole (PROTONIX) 20 MG tablet Take 1 tablet (20 mg total) by mouth 2 (two) times daily. 60 tablet Carrick Rijos, Britta MccreedyPhilip O, MD   ondansetron (ZOFRAN ODT) 4 MG disintegrating tablet Take 1 tablet (4 mg total) by mouth every 8 (eight) hours as needed for nausea or vomiting. 20 tablet  Zonia Caplin, Britta MccreedyPhilip O, MD     Controlled Substance Prescriptions [place] Controlled Substance Registry consulted? No   Merrilee JanskyLamptey, Neyla Gauntt O, MD 03/28/19 [phone]
